# Patient Record
Sex: Female | Born: 1985
Health system: Southern US, Community
[De-identification: ages and names within clinical notes are randomized; demographics above are authoritative.]

## PROBLEM LIST (undated history)

## (undated) ENCOUNTER — Ambulatory Visit (HOSPITAL_COMMUNITY): Admission: EM | Payer: Self-pay | Source: Home / Self Care

## (undated) DIAGNOSIS — IMO0002 Reserved for concepts with insufficient information to code with codable children: Secondary | ICD-10-CM

## (undated) DIAGNOSIS — A749 Chlamydial infection, unspecified: Secondary | ICD-10-CM

## (undated) DIAGNOSIS — Z9889 Other specified postprocedural states: Secondary | ICD-10-CM

## (undated) DIAGNOSIS — A549 Gonococcal infection, unspecified: Secondary | ICD-10-CM

## (undated) DIAGNOSIS — R87619 Unspecified abnormal cytological findings in specimens from cervix uteri: Secondary | ICD-10-CM

## (undated) HISTORY — DX: Reserved for concepts with insufficient information to code with codable children: IMO0002

## (undated) HISTORY — DX: Unspecified abnormal cytological findings in specimens from cervix uteri: R87.619

---
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
## 2010-05-06 ENCOUNTER — Encounter: Payer: Self-pay | Admitting: *Deleted

## 2010-05-06 ENCOUNTER — Encounter: Payer: Self-pay | Admitting: Diagnostic Radiology

## 2011-01-29 ENCOUNTER — Encounter: Payer: Self-pay | Admitting: Physician Assistant

## 2011-02-27 ENCOUNTER — Encounter: Payer: Self-pay | Admitting: Physician Assistant

## 2011-03-29 ENCOUNTER — Encounter: Payer: Self-pay | Admitting: Physician Assistant

## 2011-05-10 ENCOUNTER — Encounter: Payer: Self-pay | Admitting: Physician Assistant

## 2011-05-13 ENCOUNTER — Encounter: Payer: Self-pay | Admitting: Obstetrics & Gynecology

## 2011-05-17 ENCOUNTER — Ambulatory Visit: Payer: Self-pay | Admitting: Obstetrics and Gynecology

## 2011-07-05 ENCOUNTER — Other Ambulatory Visit (HOSPITAL_COMMUNITY)
Admission: RE | Admit: 2011-07-05 | Discharge: 2011-07-05 | Disposition: A | Discharge status: Home / Self Care | Payer: Self-pay | Source: Ambulatory Visit | Attending: Obstetrics & Gynecology | Admitting: Obstetrics & Gynecology

## 2011-07-05 ENCOUNTER — Ambulatory Visit (INDEPENDENT_AMBULATORY_CARE_PROVIDER_SITE_OTHER): Payer: Self-pay | Admitting: Physician Assistant

## 2011-07-05 ENCOUNTER — Encounter: Payer: Self-pay | Admitting: Physician Assistant

## 2011-07-05 VITALS — BP 122/87 | HR 95 | Temp 98.0°F | Ht 62.0 in | Wt 169.3 lb

## 2011-07-05 DIAGNOSIS — IMO0002 Reserved for concepts with insufficient information to code with codable children: Secondary | ICD-10-CM

## 2011-07-05 DIAGNOSIS — Z01812 Encounter for preprocedural laboratory examination: Secondary | ICD-10-CM

## 2011-07-05 DIAGNOSIS — R87619 Unspecified abnormal cytological findings in specimens from cervix uteri: Secondary | ICD-10-CM | POA: Insufficient documentation

## 2011-07-05 DIAGNOSIS — R87811 Vaginal high risk human papillomavirus (HPV) DNA test positive: Secondary | ICD-10-CM

## 2011-07-05 LAB — POCT PREGNANCY, URINE: Preg Test, Ur: NEGATIVE

## 2011-07-05 NOTE — Patient Instructions (Signed)
Colposcopy Care After Colposcopy is a procedure in which a special tool is used to magnify the surface of the cervix. A tissue sample (biopsy) may also be taken. This sample will be looked at for cervical cancer or other problems. After the test:  You may have some cramping.   Lie down for a few minutes if you feel lightheaded.    You may have some bleeding which should stop in a few days.  HOME CARE  Do not have sex or use tampons for 2 to 3 days or as told.   Only take medicine as told by your doctor.   Continue to take your birth control pills as usual.  Finding out the results of your test Ask when your test results will be ready. Make sure you get your test results. GET HELP RIGHT AWAY IF:  You are bleeding a lot or are passing blood clots.   You develop a fever of 102 F (38.9 C) or higher.   You have abnormal vaginal discharge.   You have cramps that do not go away with medicine.   You feel lightheaded, dizzy, or pass out (faint).  MAKE SURE YOU:   Understand these instructions.   Will watch your condition.   Will get help right away if you are not doing well or get worse.  Document Released: 09/18/2007 Document Revised: 03/21/2011 Document Reviewed: 09/18/2007 ExitCare Patient Information 2012 [employer]. 

## 2011-07-05 NOTE — Progress Notes (Signed)
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 Previous colpo was attempted at [facility] but was unsatisfactory. Patient given informed consent, signed copy in the chart, time out was performed.  Placed in lithotomy position. Cervix viewed with speculum and colposcope after application of acetic acid. SCJ was visible with the manipulation of the os with sterile q-tip.  Colposcopy adequate?  yes Acetowhite lesions?yes Punctation?no Mosaicism?  no Abnormal vasculature?  no Biopsies?yes ECC?yes  COMMENTS:Favor HPV/CIN 1 Patient was given post procedure instructions.  She will return in 2 weeks for results.

## 2011-07-08 ENCOUNTER — Other Ambulatory Visit: Payer: Self-pay | Admitting: Physician Assistant

## 2011-07-08 ENCOUNTER — Telehealth: Payer: Self-pay | Admitting: Physician Assistant

## 2011-07-08 NOTE — Telephone Encounter (Signed)
Message copied by Lynnell Dike on Mon Jul 08, 2011  3:58 PM ------      Message from: Maylon Cos E      Created: Mon Jul 08, 2011  3:22 PM       IF patient calls back. Please give colpo results and plan for repeat pap q 6 months. Cancel FU appt, not needed.

## 2011-07-08 NOTE — Telephone Encounter (Signed)
Telephoned pt and she advised had already spoke to Maylon Cos, [place]

## 2011-08-02 ENCOUNTER — Ambulatory Visit: Payer: Self-pay | Admitting: Obstetrics and Gynecology

## 2012-09-09 ENCOUNTER — Ambulatory Visit: Payer: Self-pay | Admitting: Internal Medicine

## 2012-12-13 ENCOUNTER — Emergency Department (HOSPITAL_COMMUNITY): Payer: PRIVATE HEALTH INSURANCE

## 2012-12-13 ENCOUNTER — Encounter (HOSPITAL_COMMUNITY): Payer: Self-pay | Admitting: *Deleted

## 2012-12-13 ENCOUNTER — Emergency Department (HOSPITAL_COMMUNITY)
Admission: EM | Admit: 2012-12-13 | Discharge: 2012-12-13 | Disposition: A | Discharge status: Home / Self Care | Payer: PRIVATE HEALTH INSURANCE | Attending: Emergency Medicine | Admitting: Emergency Medicine

## 2012-12-13 DIAGNOSIS — R0789 Other chest pain: Secondary | ICD-10-CM | POA: Insufficient documentation

## 2012-12-13 DIAGNOSIS — R0602 Shortness of breath: Secondary | ICD-10-CM | POA: Insufficient documentation

## 2012-12-13 DIAGNOSIS — Z791 Long term (current) use of non-steroidal anti-inflammatories (NSAID): Secondary | ICD-10-CM | POA: Insufficient documentation

## 2012-12-13 LAB — CBC WITH DIFFERENTIAL/PLATELET
Basophils Relative: 1 % (ref 0–1)
Eosinophils Relative: 2 % (ref 0–5)
HCT: 43.1 % (ref 36.0–46.0)
Hemoglobin: 15 g/dL (ref 12.0–15.0)
MCH: 30.7 pg (ref 26.0–34.0)
MCHC: 34.8 g/dL (ref 30.0–36.0)
MCV: 88.1 fL (ref 78.0–100.0)
Monocytes Absolute: 0.6 10*3/uL (ref 0.1–1.0)
Monocytes Relative: 7 % (ref 3–12)
Neutro Abs: 3.9 10*3/uL (ref 1.7–7.7)
RDW: 13 % (ref 11.5–15.5)

## 2012-12-13 LAB — POCT I-STAT, CHEM 8
Calcium, Ion: 1.19 mmol/L (ref 1.12–1.23)
Chloride: 109 mEq/L (ref 96–112)
Creatinine, Ser: 1.1 mg/dL (ref 0.50–1.10)
Glucose, Bld: 86 mg/dL (ref 70–99)
Hemoglobin: 15.6 g/dL — ABNORMAL HIGH (ref 12.0–15.0)
Potassium: 3.4 mEq/L — ABNORMAL LOW (ref 3.5–5.1)

## 2012-12-13 MED ORDER — KETOROLAC TROMETHAMINE 30 MG/ML IJ SOLN
30.0000 mg | Freq: Once | INTRAMUSCULAR | Status: AC
  Administered 2012-12-13: 30 mg via INTRAVENOUS
  Filled 2012-12-13: qty 1

## 2012-12-13 MED ORDER — NAPROXEN 500 MG PO TABS: 500.0000 mg | ORAL_TABLET | Freq: Two times a day (BID) | ORAL | Status: DC

## 2012-12-13 NOTE — ED Notes (Signed)
Pt at X-ray

## 2012-12-13 NOTE — ED Notes (Signed)
Gail, NP at bedside. 

## 2012-12-13 NOTE — ED Provider Notes (Signed)
CSN: [id number]     Arrival date & time 12/13/12  4098 History   First MD Initiated Contact with Patient 12/13/12 1911     Chief Complaint  Patient presents with  . Chest Pain   (Consider location/radiation/quality/duration/timing/severity/associated sxs/prior Treatment) HPI Comments: Patient has been having central chest pain lasting minutes to hours.  Patient can not relate aggravating  Factors.  States can take Ibuprofen with relief.  Denies fever, neck pain, cough, Hx asthma, recent travel or chest wall trauma. Does get regular IM injections of Depo for the past 8 years   Patient is a 27 y.o. female presenting with chest pain. The history is provided by the patient.  Chest Pain Pain location:  Substernal area Pain quality: sharp and stabbing   Pain radiates to:  Does not radiate Pain radiates to the back: no   Pain severity:  Severe Onset quality:  Sudden Timing:  Intermittent Progression:  Unable to specify Chronicity:  New Associated symptoms: shortness of breath   Associated symptoms: no cough, no dizziness, no fever, no headache and no nausea     Past Medical History  Diagnosis Date  . Abnormal Pap smear    Past Surgical History  Procedure Laterality Date  . Cesarean section     Family History  Problem Relation Age of Onset  . Hypertension Maternal Grandmother   . Diabetes Maternal Grandmother   . Hypertension Maternal Grandfather   . Diabetes Maternal Grandfather    History  Substance Use Topics  . Smoking status: Never Smoker   . Smokeless tobacco: Never Used  . Alcohol Use: Yes     Comment: occasional    OB History   Grav Para Term Preterm Abortions TAB SAB Ect Mult Living   2 1 1  1 1    1      Review of Systems  Constitutional: Negative for fever and chills.  HENT: Negative for neck pain.   Respiratory: Positive for shortness of breath. Negative for cough, chest tightness and wheezing.   Cardiovascular: Positive for chest pain. Negative for leg  swelling.  Gastrointestinal: Negative for nausea.  Skin: Negative for wound.  Neurological: Negative for dizziness and headaches.  All other systems reviewed and are negative.    Allergies  Doxycycline  Home Medications   Current Outpatient Rx  Name  Route  Sig  Dispense  Refill  . acetaminophen (TYLENOL) 500 MG tablet   Oral   Take 500 mg by mouth every 6 (six) hours as needed for pain.         Marland Kitchen ibuprofen (ADVIL,MOTRIN) 200 MG tablet   Oral   Take 200 mg by mouth every 6 (six) hours as needed for pain.         . medroxyPROGESTERone (DEPO-PROVERA) 150 MG/ML injection   Intramuscular   Inject 150 mg into the muscle every 3 (three) months.         . naproxen (NAPROSYN) 500 MG tablet   Oral   Take 1 tablet (500 mg total) by mouth 2 (two) times daily.   30 tablet   0    BP 101/72  Pulse 73  Temp(Src) 97.9 F (36.6 C) (Oral)  Resp 24  Ht 5\' 2"  (1.575 m)  Wt 183 lb (83.008 kg)  BMI 33.46 kg/m2  SpO2 100% Physical Exam  Nursing note and vitals reviewed. Constitutional: She appears well-developed and well-nourished.  HENT:  Head: Normocephalic.  Eyes: Pupils are equal, round, and reactive to light.  Neck: Normal  range of motion.  Cardiovascular: Normal rate and regular rhythm.   Pulmonary/Chest: Effort normal and breath sounds normal. No respiratory distress. She has no wheezes. She exhibits tenderness.  Abdominal: Soft. Bowel sounds are normal.  Neurological: She is alert.  Skin: Skin is warm.    ED Course  Procedures (including critical care time) Labs Review Labs Reviewed  POCT I-STAT, CHEM 8 - Abnormal; Notable for the following:    Potassium 3.4 (*)    Hemoglobin 15.6 (*)    All other components within normal limits  CBC WITH DIFFERENTIAL  D-DIMER, QUANTITATIVE  POCT I-STAT TROPONIN I   Imaging Review Dg Chest 2 View  12/13/2012   *RADIOLOGY REPORT*  Clinical Data: Chest pain  CHEST - 2 VIEW  Comparison: None.  Findings: Cardiac leads project  over the chest.  The heart, mediastinal, and hilar contours are within normal limits.  The pulmonary vascularity is normal.  The trachea is midline.  Lungs are well expanded and clear.  No acute osseous abnormality. Visualized bowel gas pattern is normal.  IMPRESSION: No acute cardiopulmonary disease.   Original Report Authenticated By: Britta Mccreedy, M.D.    MDM   1. Chest pain, atypical    Negative cardia evalaution as well as your lung examination      Arman Filter, NP 12/13/12 2219

## 2012-12-13 NOTE — ED Notes (Signed)
Patient states that she started having chest pain on Tuesday of this week and it lasted throughout most of the day. Pain has been intermittent and is described as mid chest and feels sharp. Patient states she because she was at work and the pain was severe and she felt as if she could not catch her breath so her co workers called EMS.

## 2012-12-13 NOTE — ED Notes (Signed)
Patient received Aspirin 324 mg from EMS

## 2012-12-13 NOTE — ED Notes (Signed)
Pt currently not experiencing SOB or having diaphoresis but reported those sx with earlier episode of chest pain.

## 2012-12-13 NOTE — ED Notes (Signed)
Pt able to ambulate to restroom with no difficulty.

## 2012-12-13 NOTE — ED Notes (Signed)
Bed: WU98 Expected date:  Expected time:  Means of arrival:  Comments: EMS-CP

## 2012-12-15 NOTE — ED Provider Notes (Signed)
Medical screening examination/treatment/procedure(s) were performed by non-physician practitioner and as supervising physician I was immediately available for consultation/collaboration.    Nelia Shi, MD 12/15/12 [id number]

## 2013-07-03 ENCOUNTER — Emergency Department (INDEPENDENT_AMBULATORY_CARE_PROVIDER_SITE_OTHER)
Admission: EM | Admit: 2013-07-03 | Discharge: 2013-07-03 | Disposition: A | Discharge status: Home / Self Care | Payer: PRIVATE HEALTH INSURANCE | Source: Home / Self Care | Attending: Family Medicine | Admitting: Family Medicine

## 2013-07-03 ENCOUNTER — Other Ambulatory Visit (HOSPITAL_COMMUNITY)
Admission: RE | Admit: 2013-07-03 | Discharge: 2013-07-03 | Disposition: A | Discharge status: Home / Self Care | Payer: PRIVATE HEALTH INSURANCE | Source: Ambulatory Visit | Attending: Family Medicine | Admitting: Family Medicine

## 2013-07-03 ENCOUNTER — Encounter (HOSPITAL_COMMUNITY): Payer: Self-pay | Admitting: Emergency Medicine

## 2013-07-03 DIAGNOSIS — N760 Acute vaginitis: Secondary | ICD-10-CM | POA: Insufficient documentation

## 2013-07-03 DIAGNOSIS — Z113 Encounter for screening for infections with a predominantly sexual mode of transmission: Secondary | ICD-10-CM | POA: Insufficient documentation

## 2013-07-03 DIAGNOSIS — Z202 Contact with and (suspected) exposure to infections with a predominantly sexual mode of transmission: Secondary | ICD-10-CM

## 2013-07-03 HISTORY — DX: Gonococcal infection, unspecified: A54.9

## 2013-07-03 HISTORY — DX: Other specified postprocedural states: Z98.890

## 2013-07-03 HISTORY — DX: Chlamydial infection, unspecified: A74.9

## 2013-07-03 LAB — POCT URINALYSIS DIP (DEVICE)
BILIRUBIN URINE: NEGATIVE
Glucose, UA: NEGATIVE mg/dL
HGB URINE DIPSTICK: NEGATIVE
KETONES UR: NEGATIVE mg/dL
NITRITE: NEGATIVE
PH: 7.5 (ref 5.0–8.0)
Protein, ur: NEGATIVE mg/dL
SPECIFIC GRAVITY, URINE: 1.02 (ref 1.005–1.030)
Urobilinogen, UA: 2 mg/dL — ABNORMAL HIGH (ref 0.0–1.0)

## 2013-07-03 LAB — POCT PREGNANCY, URINE: Preg Test, Ur: NEGATIVE

## 2013-07-03 MED ORDER — AZITHROMYCIN 250 MG PO TABS
ORAL_TABLET | ORAL | Status: AC
  Filled 2013-07-03: qty 4

## 2013-07-03 MED ORDER — CEFTRIAXONE SODIUM 250 MG IJ SOLR
250.0000 mg | Freq: Once | INTRAMUSCULAR | Status: AC
  Administered 2013-07-03: 250 mg via INTRAMUSCULAR

## 2013-07-03 MED ORDER — CEFTRIAXONE SODIUM 250 MG IJ SOLR
INTRAMUSCULAR | Status: AC
  Filled 2013-07-03: qty 250

## 2013-07-03 MED ORDER — AZITHROMYCIN 250 MG PO TABS
1000.0000 mg | ORAL_TABLET | Freq: Every day | ORAL | Status: DC
  Administered 2013-07-03: 1000 mg via ORAL

## 2013-07-03 MED ORDER — AZITHROMYCIN 250 MG PO TABS: 1000.0000 mg | ORAL_TABLET | Freq: Once | ORAL | Status: DC

## 2013-07-03 MED ORDER — LIDOCAINE HCL (PF) 1 % IJ SOLN
INTRAMUSCULAR | Status: AC
  Filled 2013-07-03: qty 5

## 2013-07-03 NOTE — Discharge Instructions (Signed)
Sexually Transmitted Disease A sexually transmitted disease (STD) is a disease or infection that may be passed (transmitted) from person to person, usually during sexual activity. This may happen by way of saliva, semen, blood, vaginal mucus, or urine. Common STDs include:   Gonorrhea.   Chlamydia.   Syphilis.   HIV and AIDS.   Genital herpes.   Hepatitis B and C.   Trichomonas.   Human papillomavirus (HPV).   Pubic lice.   Scabies.  Mites.  Bacterial vaginosis. WHAT ARE CAUSES OF STDs? An STD may be caused by bacteria, a virus, or parasites. STDs are often transmitted during sexual activity if one person is infected. However, they may also be transmitted through nonsexual means. STDs may be transmitted after:   Sexual intercourse with an infected person.   Sharing sex toys with an infected person.   Sharing needles with an infected person or using unclean piercing or tattoo needles.  Having intimate contact with the genitals, mouth, or rectal areas of an infected person.   Exposure to infected fluids during birth. WHAT ARE THE SIGNS AND SYMPTOMS OF STDs? Different STDs have different symptoms. Some people may not have any symptoms. If symptoms are present, they may include:   Painful or bloody urination.   Pain in the pelvis, abdomen, vagina, anus, throat, or eyes.   Skin rash, itching, irritation, growths, sores (lesions), ulcerations, or warts in the genital or anal area.  Abnormal vaginal discharge with or without bad odor.   Penile discharge in men.   Fever.   Pain or bleeding during sexual intercourse.   Swollen glands in the groin area.   Yellow skin and eyes (jaundice). This is seen with hepatitis.   Swollen testicles.  Infertility.  Sores and blisters in the mouth. HOW ARE STDs DIAGNOSED? To make a diagnosis, your health care provider may:   Take a medical history.   Perform a physical exam.   Take a sample of any  discharge for examination.  Swab the throat, cervix, opening to the penis, rectum, or vagina for testing.  Test a sample of your first morning urine.   Perform blood tests.   Perform a Pap smear, if this applies.   Perform a colposcopy.   Perform a laparoscopy.  HOW ARE STDs TREATED? Treatment depends on the STD. Some STDs may be treated but not cured.   Chlamydia, gonorrhea, trichomonas, and syphilis can be cured with antibiotics.   Genital herpes, hepatitis, and HIV can be treated, but not cured, with prescribed medicines. The medicines lessen symptoms.   Genital warts from HPV can be treated with medicine or by freezing, burning (electrocautery), or surgery. Warts may come back.   HPV cannot be cured with medicine or surgery. However, abnormal areas may be removed from the cervix, vagina, or vulva.   If your diagnosis is confirmed, your recent sexual partners need treatment. This is true even if they are symptom-free or have a negative culture or evaluation. They should not have sex until their health care providers say it is OK. HOW CAN I REDUCE MY RISK OF GETTING AN STD?  Use latex condoms, dental dams, and water-soluble lubricants during sexual activity. Do not use petroleum jelly or oils.  Get vaccinated for HPV and hepatitis. If you have not received these vaccines in the past, talk to your health care provider about whether one or both might be right for you.   Avoid risky sex practices that can break the skin.  WHAT SHOULD   I DO IF I THINK I HAVE AN STD?  See your health care provider.   Inform all sexual partners. They should be tested and treated for any STDs.  Do not have sex until your health care provider says it is OK. WHEN SHOULD I GET HELP? Seek immediate medical care if:  You develop severe abdominal pain.  You are a man and notice swelling or pain in the testicles.  You are a woman and notice swelling or pain in your vagina. Document  Released: 06/22/2002 Document Revised: 01/20/2013 Document Reviewed: 10/20/2012 ExitCare Patient Information 2014 [employer].  

## 2013-07-03 NOTE — ED Notes (Signed)
States partner has NGU.  Pt has not vaginal discharge or any c/o's, but wishes to eval for STDs and possible treatment.

## 2013-07-03 NOTE — ED Provider Notes (Signed)
CSN: [id number]     Arrival date & time 07/03/13  1837 History   First MD Initiated Contact with Patient 07/03/13 2005     Chief Complaint  Patient presents with  . Exposure to STD   (Consider location/radiation/quality/duration/timing/severity/associated sxs/prior Treatment) HPI Comments: 28 year old female presents complaining of STD exposure. She was called by her boyfriend to say that he tested positive for NGU and that he would like her to be tested. She denies any symptoms. She specifically denies vaginal discharge, dysuria, or genital lesions, abdominal pains.  Patient is a 28 y.o. female presenting with STD exposure.  Exposure to STD Pertinent negatives include no chest pain, no abdominal pain and no shortness of breath.    Past Medical History  Diagnosis Date  . Abnormal Pap smear   . Chlamydia   . Gonorrhea   . History of elective abortion    Past Surgical History  Procedure Laterality Date  . Cesarean section     Family History  Problem Relation Age of Onset  . Hypertension Maternal Grandmother   . Diabetes Maternal Grandmother   . Hypertension Maternal Grandfather   . Diabetes Maternal Grandfather    History  Substance Use Topics  . Smoking status: Never Smoker   . Smokeless tobacco: Never Used  . Alcohol Use: Yes     Comment: occasional    OB History   Grav Para Term Preterm Abortions TAB SAB Ect Mult Living   2 1 1  1 1    1      Review of Systems  Constitutional: Negative for fever and chills.  Eyes: Negative for visual disturbance.  Respiratory: Negative for cough and shortness of breath.   Cardiovascular: Negative for chest pain, palpitations and leg swelling.  Gastrointestinal: Negative for nausea, vomiting and abdominal pain.  Endocrine: Negative for polydipsia and polyuria.  Genitourinary: Negative for dysuria, urgency, frequency, hematuria, vaginal discharge, difficulty urinating, genital sores, vaginal pain, pelvic pain and dyspareunia.   Musculoskeletal: Negative for arthralgias and myalgias.  Skin: Negative for rash.  Neurological: Negative for dizziness, weakness and light-headedness.    Allergies  Doxycycline  Home Medications   Current Outpatient Rx  Name  Route  Sig  Dispense  Refill  . medroxyPROGESTERone (DEPO-PROVERA) 150 MG/ML injection   Intramuscular   Inject 150 mg into the muscle every 3 (three) months.         . naproxen (NAPROSYN) 500 MG tablet   Oral   Take 1 tablet (500 mg total) by mouth 2 (two) times daily.   30 tablet   0   . acetaminophen (TYLENOL) 500 MG tablet   Oral   Take 500 mg by mouth every 6 (six) hours as needed for pain.         Marland Kitchen. ibuprofen (ADVIL,MOTRIN) 200 MG tablet   Oral   Take 200 mg by mouth every 6 (six) hours as needed for pain.          BP 116/83  Pulse 77  Temp(Src) 98.4 F (36.9 C) (Oral)  Resp 18  SpO2 99% Physical Exam  Nursing note and vitals reviewed. Constitutional: She is oriented to person, place, and time. Vital signs are normal. She appears well-developed and well-nourished. No distress.  HENT:  Head: Normocephalic and atraumatic.  Pulmonary/Chest: Effort normal. No respiratory distress.  Abdominal: Soft. She exhibits no mass. There is no tenderness. There is no rebound and no guarding.  Neurological: She is alert and oriented to person, place, and time. She has  normal strength. Coordination normal.  Skin: Skin is warm and dry. No rash noted. She is not diaphoretic.  Psychiatric: She has a normal mood and affect. Judgment normal.    ED Course  Procedures (including critical care time) Labs Review Labs Reviewed  POCT URINALYSIS DIP (DEVICE) - Abnormal; Notable for the following:    Urobilinogen, UA 2.0 (*)    Leukocytes, UA SMALL (*)    All other components within normal limits  RPR  HIV ANTIBODY (ROUTINE TESTING)  POCT PREGNANCY, URINE  URINE CYTOLOGY ANCILLARY ONLY   Imaging Review No results found.   MDM   1. Exposure to  STD    Treating with ceftriaxone and with azithromycin. Labs sent off for STDs. Followup when necessary.  Meds ordered this encounter  Medications  . cefTRIAXone (ROCEPHIN) injection 250 mg    Sig:   . DISCONTD: azithromycin (ZITHROMAX) tablet 1,000 mg    Sig:   . azithromycin (ZITHROMAX) tablet 1,000 mg    Sig:        Graylon Good, PA-C 07/03/13 2021

## 2013-07-03 NOTE — ED Notes (Signed)
Denies any S/S allergic reaction. 

## 2013-07-04 LAB — HIV ANTIBODY (ROUTINE TESTING W REFLEX): HIV: NONREACTIVE

## 2013-07-04 LAB — RPR: RPR Ser Ql: NONREACTIVE

## 2013-07-05 NOTE — ED Provider Notes (Signed)
Medical screening examination/treatment/procedure(s) were performed by a resident physician or non-physician practitioner and as the supervising physician I was immediately available for consultation/collaboration.  Tage Feggins, MD    Delicia Berens S Artice Bergerson, MD 07/05/13 0805 

## 2013-07-06 LAB — URINE CYTOLOGY ANCILLARY ONLY
Chlamydia: POSITIVE — AB
Neisseria Gonorrhea: NEGATIVE
TRICH (WINDOWPATH): NEGATIVE

## 2013-07-06 NOTE — ED Notes (Addendum)
GC neg., Chlamydia pos., Trich neg.  Pt. adequately treated with Zithromax and Rocephin.  DHHS [facility]. [name], [name] 07/06/2013 Prevotella Bivia pos.  [name] e-prescribed Flagyl to [facility] on [address]  I called cell # and it was not in service.  I called other number and no answer- could not leave a message. I called contact- mother and left a message for pt. to call. Call 1. [name], [name] 07/08/2013

## 2013-07-07 LAB — URINE CYTOLOGY ANCILLARY ONLY
BACTERIAL VAGINITIS: POSITIVE — AB
Bacterial vaginitis: POSITIVE — AB
CANDIDA VAGINITIS: NEGATIVE

## 2013-07-08 ENCOUNTER — Telehealth (HOSPITAL_COMMUNITY): Payer: Self-pay | Admitting: *Deleted

## 2013-07-08 ENCOUNTER — Telehealth (HOSPITAL_COMMUNITY): Payer: Self-pay | Admitting: Emergency Medicine

## 2013-07-08 MED ORDER — METRONIDAZOLE 500 MG PO TABS: 500.0000 mg | ORAL_TABLET | Freq: Two times a day (BID) | ORAL | Status: DC

## 2013-07-08 NOTE — Telephone Encounter (Signed)
Kristin AndersonSuzanne York, RN, presented lab results. Pt has bacterial vaginosis. Rx flagyl to cover. Also pos for chlamydia but was tx at time of original visit. S Lang will contact pt with results.

## 2013-07-09 NOTE — ED Notes (Addendum)
I called pt. Pt. verified x 2 and given results.  Pt. Told she was adequately treated Zithromax for Chlamydia and needs Flagyl for bacterial vaginosis.  Pt. instructed to notify her partner, no sex for 1 week and to practice safe sex. Pt. told she can [facility], by appointment.  Pt. told where to pick up her Rx. of Flagyl.   Pt. instructed to no alcohol while taking this medication.  Pt. voiced understanding. [name], Zuriyah Shatz M 07/09/2013 Phone number corrected in system. 07/09/2013

## 2014-02-14 ENCOUNTER — Encounter (HOSPITAL_COMMUNITY): Payer: Self-pay | Admitting: Emergency Medicine

## 2015-08-21 ENCOUNTER — Emergency Department (HOSPITAL_COMMUNITY)
Admission: EM | Admit: 2015-08-21 | Discharge: 2015-08-21 | Disposition: A | Discharge status: Home / Self Care | Payer: PRIVATE HEALTH INSURANCE | Attending: Emergency Medicine | Admitting: Emergency Medicine

## 2015-08-21 ENCOUNTER — Encounter (HOSPITAL_COMMUNITY): Payer: Self-pay | Admitting: Emergency Medicine

## 2015-08-21 ENCOUNTER — Emergency Department (HOSPITAL_COMMUNITY): Payer: PRIVATE HEALTH INSURANCE

## 2015-08-21 DIAGNOSIS — Z8619 Personal history of other infectious and parasitic diseases: Secondary | ICD-10-CM | POA: Insufficient documentation

## 2015-08-21 DIAGNOSIS — S3992XA Unspecified injury of lower back, initial encounter: Secondary | ICD-10-CM | POA: Insufficient documentation

## 2015-08-21 DIAGNOSIS — Y998 Other external cause status: Secondary | ICD-10-CM | POA: Diagnosis not present

## 2015-08-21 DIAGNOSIS — Y9389 Activity, other specified: Secondary | ICD-10-CM | POA: Insufficient documentation

## 2015-08-21 DIAGNOSIS — Z792 Long term (current) use of antibiotics: Secondary | ICD-10-CM | POA: Insufficient documentation

## 2015-08-21 DIAGNOSIS — S4991XA Unspecified injury of right shoulder and upper arm, initial encounter: Secondary | ICD-10-CM | POA: Diagnosis present

## 2015-08-21 DIAGNOSIS — M546 Pain in thoracic spine: Secondary | ICD-10-CM

## 2015-08-21 DIAGNOSIS — M545 Low back pain, unspecified: Secondary | ICD-10-CM

## 2015-08-21 DIAGNOSIS — Z791 Long term (current) use of non-steroidal anti-inflammatories (NSAID): Secondary | ICD-10-CM | POA: Diagnosis not present

## 2015-08-21 DIAGNOSIS — S29002A Unspecified injury of muscle and tendon of back wall of thorax, initial encounter: Secondary | ICD-10-CM | POA: Insufficient documentation

## 2015-08-21 DIAGNOSIS — Z793 Long term (current) use of hormonal contraceptives: Secondary | ICD-10-CM | POA: Diagnosis not present

## 2015-08-21 DIAGNOSIS — Y92410 Unspecified street and highway as the place of occurrence of the external cause: Secondary | ICD-10-CM | POA: Insufficient documentation

## 2015-08-21 DIAGNOSIS — M25511 Pain in right shoulder: Secondary | ICD-10-CM

## 2015-08-21 LAB — POC URINE PREG, ED: Preg Test, Ur: NEGATIVE

## 2015-08-21 MED ORDER — IBUPROFEN 800 MG PO TABS: 800.0000 mg | ORAL_TABLET | Freq: Three times a day (TID) | ORAL | Status: DC

## 2015-08-21 MED ORDER — CYCLOBENZAPRINE HCL 10 MG PO TABS
10.0000 mg | ORAL_TABLET | Freq: Two times a day (BID) | ORAL | Status: DC | PRN
Start: 2015-08-21 — End: 2019-04-03

## 2015-08-21 MED ORDER — DIAZEPAM 5 MG PO TABS
5.0000 mg | ORAL_TABLET | Freq: Once | ORAL | Status: AC
  Administered 2015-08-21: 5 mg via ORAL
  Filled 2015-08-21: qty 1

## 2015-08-21 MED ORDER — ACETAMINOPHEN 325 MG PO TABS
650.0000 mg | ORAL_TABLET | Freq: Once | ORAL | Status: AC
  Administered 2015-08-21: 650 mg via ORAL
  Filled 2015-08-21: qty 2

## 2015-08-21 MED ORDER — IBUPROFEN 800 MG PO TABS
800.0000 mg | ORAL_TABLET | Freq: Once | ORAL | Status: AC
  Administered 2015-08-21: 800 mg via ORAL
  Filled 2015-08-21: qty 1

## 2015-08-21 NOTE — Discharge Instructions (Signed)
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
## 2016-07-27 ENCOUNTER — Encounter (HOSPITAL_COMMUNITY): Payer: Self-pay

## 2016-07-27 ENCOUNTER — Emergency Department (HOSPITAL_COMMUNITY)
Admission: EM | Admit: 2016-07-27 | Discharge: 2016-07-27 | Disposition: A | Discharge status: Home / Self Care | Payer: PRIVATE HEALTH INSURANCE | Attending: Emergency Medicine | Admitting: Emergency Medicine

## 2016-07-27 DIAGNOSIS — J069 Acute upper respiratory infection, unspecified: Secondary | ICD-10-CM | POA: Insufficient documentation

## 2016-07-27 DIAGNOSIS — B9789 Other viral agents as the cause of diseases classified elsewhere: Secondary | ICD-10-CM

## 2016-07-27 LAB — RAPID STREP SCREEN (MED CTR MEBANE ONLY): STREPTOCOCCUS, GROUP A SCREEN (DIRECT): NEGATIVE

## 2016-07-27 MED ORDER — DEXAMETHASONE SODIUM PHOSPHATE 10 MG/ML IJ SOLN
10.0000 mg | Freq: Once | INTRAMUSCULAR | Status: AC
  Administered 2016-07-27: 10 mg via INTRAMUSCULAR
  Filled 2016-07-27: qty 1

## 2016-07-27 MED ORDER — BENZONATATE 100 MG PO CAPS: 100.0000 mg | ORAL_CAPSULE | Freq: Three times a day (TID) | ORAL | 0 refills | Status: DC

## 2016-07-27 MED ORDER — BENZONATATE 100 MG PO CAPS
100.0000 mg | ORAL_CAPSULE | Freq: Once | ORAL | Status: AC
  Administered 2016-07-27: 100 mg via ORAL
  Filled 2016-07-27: qty 1

## 2016-07-27 MED ORDER — FLUTICASONE PROPIONATE 50 MCG/ACT NA SUSP: 2.0000 | Freq: Every day | NASAL | 12 refills | Status: DC

## 2016-07-27 NOTE — ED Provider Notes (Signed)
[facility]-EMERGENCY DEPT Provider Note   CSN: [id number] Arrival date & time: 07/27/16  4098     History   Chief Complaint Chief Complaint  Patient presents with  . Sore Throat    HPI Kristin Lang is a 31 y.o. female.  HPI 31 year old African-American female with no significant past medical history presents to the ED today with complaints of URI symptoms including sore throat, nonproductive cough, rhinorrhea onset 3 days ago. Patient states that it is painful to swallow. She endorses a dry nonproductive cough along with rhinorrhea and sinus congestion. Denies any fevers. 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 Says that she's had strep throat in the past and this feels similar. She did take ibuprofen at home with some relief in her sore throat which has slightly resolved since being in the ED today. She denies any shortness of breath, difficulty swallowing, difficulty breathing, chest pain, nausea, vomiting, abdominal pain. Past Medical History:  Diagnosis Date  . Abnormal Pap smear   . Chlamydia   . Gonorrhea   . History of elective abortion     Patient Active Problem List   Diagnosis Date Noted  . 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 CESAREAN SECTION      OB History    Gravida Para Term Preterm AB Living   SAB TAB Ectopic Multiple Live Births     1             Home Medications    Prior to Admission medications   Medication Sig Start Date End Date Taking? Authorizing Provider  acetaminophen (TYLENOL) 500 MG tablet Take 500 mg by mouth every 6 (six) hours as needed for pain.    Historical Provider, MD  benzonatate (TESSALON) 100 MG capsule Take 1 capsule (100 mg total) by mouth every 8 (eight) hours. 07/27/16   Rise Mu, PA-C  cyclobenzaprine (FLEXERIL) 10 MG tablet Take 1 tablet (10 mg total) by mouth 2 (two) times daily as needed for  muscle spasms. 08/21/15   Kayla Rose, PA-C  fluticasone (FLONASE) 50 MCG/ACT nasal spray Place 2 sprays into both nostrils daily. 07/27/16   Rise Mu, PA-C  ibuprofen (ADVIL,MOTRIN) 800 MG tablet Take 1 tablet (800 mg total) by mouth 3 (three) times daily. 08/21/15   Cheri Fowler, PA-C  medroxyPROGESTERone (DEPO-PROVERA) 150 MG/ML injection Inject 150 mg into the muscle every 3 (three) months.    Historical Provider, MD  metroNIDAZOLE (FLAGYL) 500 MG tablet Take 1 tablet (500 mg total) by mouth 2 (two) times daily. 07/08/13   Cathlyn Parsons, NP  naproxen (NAPROSYN) 500 MG tablet Take 1 tablet (500 mg total) by mouth 2 (two) times daily. 12/13/12   Earley Favor, NP    Family History Family History  Problem Relation Age of Onset  . Hypertension Maternal Grandmother   . Diabetes Maternal Grandmother   . Hypertension Maternal Grandfather   . Diabetes Maternal Grandfather     Social History Social History  Substance Use Topics  . Smoking status: Never Smoker  . Smokeless tobacco: Never Used  . Alcohol use Yes     Comment: occasional      Allergies   Doxycycline   Review of Systems Review of Systems  Constitutional: Negative for chills and fever.  HENT: Positive for congestion, postnasal drip, rhinorrhea, sinus pressure,  sneezing and sore throat. Negative for trouble swallowing and voice change.   Eyes: Negative for visual disturbance.  Respiratory: Positive for cough. Negative for shortness of breath.   Cardiovascular: Negative for chest pain.  Gastrointestinal: Negative for nausea and vomiting.  Neurological: Negative for headaches.     Physical Exam Updated Vital Signs BP 109/89 (BP Location: Left Arm)   Pulse 90   Temp 98.2 F (36.8 C) (Oral)   Resp 12   SpO2 100%   Physical Exam  Constitutional: She is oriented to person, place, and time. She appears well-developed and well-nourished. No distress.  HENT:  Head: Normocephalic and atraumatic.  Right Ear: Tympanic  membrane, external ear and ear canal normal.  Left Ear: Tympanic membrane, external ear and ear canal normal.  Nose: Mucosal edema and rhinorrhea present.  Mouth/Throat: Uvula is midline and mucous membranes are normal. No trismus in the jaw. No uvula swelling. Posterior oropharyngeal edema and posterior oropharyngeal erythema present. No oropharyngeal exudate or tonsillar abscesses. Tonsils are 1+ on the right. Tonsils are 1+ on the left. No tonsillar exudate.  Eyes: Right eye exhibits no discharge. Left eye exhibits no discharge. No scleral icterus.  Neck: Normal range of motion. Neck supple.  Pulmonary/Chest: Effort normal and breath sounds normal. No respiratory distress. She has no wheezes. She has no rales. She exhibits no tenderness.  Musculoskeletal: Normal range of motion.  Lymphadenopathy:    She has no cervical adenopathy.  Neurological: She is alert and oriented to person, place, and time.  Skin: Capillary refill takes less than 2 seconds. No pallor.  Nursing note and vitals reviewed.    ED Treatments / Results  Labs (all labs ordered are listed, but only abnormal results are displayed) Labs Reviewed  RAPID STREP SCREEN (NOT AT [facility])  CULTURE, GROUP A STREP [facility])    EKG  EKG Interpretation None       Radiology No results found.  Procedures Procedures (including critical care time)  Medications Ordered in ED Medications  dexamethasone (DECADRON) injection 10 mg (10 mg Intramuscular Given 07/27/16 0920)  benzonatate (TESSALON) capsule 100 mg (100 mg Oral Given 07/27/16 6045)     Initial Impression / Assessment and Plan / ED Course  I have reviewed the triage vital signs and the nursing notes.  Pertinent labs & imaging results that were available during my care of the patient were reviewed by me and considered in my medical decision making (see chart for details).     Patient presents to the ED with URI symptoms. Lungs clear to auscultation bilaterally. Do  not feel a chest x-ray is indicated at this time. Pt afebrile without tonsillar exudate, negative strep. Presents with mild dysphagia; diagnosis of viral pharyngitis vs viral URI with cough. No abx indicated. DC w symptomatic tx for pain  Pt does not appear dehydrated, but did discuss importance of water rehydration. Presentation non concerning for PTA or infxn spread to soft tissue. No trismus or uvula deviation. Specific return precautions discussed. Pt able to drink water in ED without difficulty with intact air way. Recommended PCP follow up.   Final Clinical Impressions(s) / ED Diagnoses   Final diagnoses:  Viral URI with cough    New Prescriptions Discharge Medication List as of 07/27/2016  9:14 AM    START taking these medications   Details  benzonatate (TESSALON) 100 MG capsule Take 1 capsule (100 mg total) by mouth every 8 (eight) hours., Starting Sat 07/27/2016, Print    fluticasone (FLONASE) 50  MCG/ACT nasal spray Place 2 sprays into both nostrils daily., Starting Sat 07/27/2016, Print         Rise Mu, PA-C 07/27/16 1004    Tilden Fossa, MD 07/28/16 [phone]

## 2016-07-27 NOTE — ED Notes (Signed)
Bed: WTR6 Expected date:  Expected time:  Means of arrival:  Comments: 

## 2016-07-27 NOTE — Discharge Instructions (Signed)
Your strep test was negative. This is likely a viral upper respiratory tract infection. Motrin Tylenol for pain. Take Tessalon for cough. Use the Flonase for nasal congestion. Follow-up with primary care doctor symptoms are not improving. Return to the ED if swelling worsens or or if he developed worsening cough with sputum production and fever.

## 2016-07-27 NOTE — ED Triage Notes (Signed)
She c/o sore throat plus occasional cough x 3 days.

## 2016-07-29 LAB — CULTURE, GROUP A STREP (THRC)

## 2017-07-31 DIAGNOSIS — Z3042 Encounter for surveillance of injectable contraceptive: Secondary | ICD-10-CM | POA: Diagnosis not present

## 2017-07-31 DIAGNOSIS — J302 Other seasonal allergic rhinitis: Secondary | ICD-10-CM | POA: Diagnosis not present

## 2017-07-31 DIAGNOSIS — J029 Acute pharyngitis, unspecified: Secondary | ICD-10-CM | POA: Diagnosis not present

## 2017-10-22 DIAGNOSIS — Z3042 Encounter for surveillance of injectable contraceptive: Secondary | ICD-10-CM | POA: Diagnosis not present

## 2018-01-07 DIAGNOSIS — Z3042 Encounter for surveillance of injectable contraceptive: Secondary | ICD-10-CM | POA: Diagnosis not present

## 2018-04-06 DIAGNOSIS — Z3042 Encounter for surveillance of injectable contraceptive: Secondary | ICD-10-CM | POA: Diagnosis not present

## 2018-05-05 DIAGNOSIS — J301 Allergic rhinitis due to pollen: Secondary | ICD-10-CM | POA: Diagnosis not present

## 2018-05-05 MED FILL — SM LORATADINE 10 MG TABS: 10 | 90 days supply | Qty: 90 | Fill #0

## 2018-05-05 MED FILL — hydrOXYzine HCL 50 MG TABS: 50 | 90 days supply | Qty: 90 | Fill #0

## 2018-07-02 DIAGNOSIS — Z3042 Encounter for surveillance of injectable contraceptive: Secondary | ICD-10-CM | POA: Diagnosis not present

## 2018-07-27 ENCOUNTER — Telehealth: Payer: Self-pay | Admitting: Family

## 2018-07-27 MED ORDER — SULFAMETHOXAZOLE-TRIMETHOPRIM 800-160 MG PO TABS: 1.0000 | ORAL_TABLET | Freq: Two times a day (BID) | ORAL | 0 refills | Status: DC

## 2018-07-27 MED ORDER — PHENAZOPYRIDINE HCL 100 MG PO TABS: 100.0000 mg | ORAL_TABLET | Freq: Three times a day (TID) | ORAL | 0 refills | Status: DC | PRN

## 2018-07-27 NOTE — Telephone Encounter (Signed)
Bactrim DS 800/160 mg tablets BID, # 20 with no RF's and Pyridium 100 mg 3 times daily for pain, # 10 with no RF's. RX for above e-scribed and sent to pharmacy on record  [facility] - [place], [place] - [place] [address] [place] [address] Phone: [phone] Fax: [phone]

## 2018-09-18 DIAGNOSIS — Z0000 Encounter for general adult medical examination without abnormal findings: Secondary | ICD-10-CM | POA: Diagnosis not present

## 2018-09-18 DIAGNOSIS — Z3042 Encounter for surveillance of injectable contraceptive: Secondary | ICD-10-CM | POA: Diagnosis not present

## 2018-11-13 ENCOUNTER — Telehealth: Payer: Self-pay | Admitting: Family

## 2018-11-13 MED ORDER — CEPHALEXIN 500 MG PO CAPS: 500.0000 mg | ORAL_CAPSULE | Freq: Two times a day (BID) | ORAL | 1 refills | Status: DC

## 2018-11-13 NOTE — Telephone Encounter (Signed)
Patient having an issue with boils under her arm and needing Abx for treatment. Keflex 500 mg BID # 20 for 10 day treatment with 1 RF's. RX for above e-scribed and sent to pharmacy on record  [place], [place] [address] Phone: [phone] Fax: [phone]

## 2018-11-30 DIAGNOSIS — Z111 Encounter for screening for respiratory tuberculosis: Secondary | ICD-10-CM | POA: Diagnosis not present

## 2018-12-03 DIAGNOSIS — Z3042 Encounter for surveillance of injectable contraceptive: Secondary | ICD-10-CM | POA: Diagnosis not present

## 2019-02-16 DIAGNOSIS — Z3042 Encounter for surveillance of injectable contraceptive: Secondary | ICD-10-CM | POA: Diagnosis not present

## 2019-04-03 ENCOUNTER — Ambulatory Visit (HOSPITAL_COMMUNITY)
Admission: EM | Admit: 2019-04-03 | Discharge: 2019-04-03 | Disposition: A | Discharge status: Home / Self Care | Payer: 59 | Attending: Urgent Care | Admitting: Urgent Care

## 2019-04-03 ENCOUNTER — Encounter (HOSPITAL_COMMUNITY): Payer: Self-pay | Admitting: *Deleted

## 2019-04-03 ENCOUNTER — Other Ambulatory Visit: Payer: Self-pay

## 2019-04-03 DIAGNOSIS — M545 Low back pain, unspecified: Secondary | ICD-10-CM

## 2019-04-03 DIAGNOSIS — M5380 Other specified dorsopathies, site unspecified: Secondary | ICD-10-CM

## 2019-04-03 DIAGNOSIS — S39012A Strain of muscle, fascia and tendon of lower back, initial encounter: Secondary | ICD-10-CM

## 2019-04-03 MED ORDER — CYCLOBENZAPRINE HCL 10 MG PO TABS: 10.0000 mg | ORAL_TABLET | Freq: Two times a day (BID) | ORAL | 0 refills | Status: DC | PRN

## 2019-04-03 MED ORDER — PREDNISONE 20 MG PO TABS: ORAL_TABLET | ORAL | 0 refills | Status: DC

## 2019-04-03 NOTE — ED Triage Notes (Signed)
Pt states slept in a recliner approx 2 wks ago and had some slight left low back pain; now low back pain radiating into middle of low back.  Has been taking IBU, Naproxen, and using heat without relief.  Pain worse with sitting.  Denies parasthesias.
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
## 2019-05-07 DIAGNOSIS — Z30019 Encounter for initial prescription of contraceptives, unspecified: Secondary | ICD-10-CM | POA: Diagnosis not present

## 2019-05-27 ENCOUNTER — Telehealth: Payer: Self-pay | Admitting: Family

## 2019-05-27 MED ORDER — FLUCONAZOLE 150 MG PO TABS: 150.0000 mg | ORAL_TABLET | Freq: Every day | ORAL | 0 refills | Status: DC

## 2019-05-27 NOTE — Telephone Encounter (Signed)
Diflucan 150 mg 5 tablets with no RF's.RX for above e-scribed and sent to pharmacy on record  [facility] - [place], [place] - [place] [address] [place] [address] Phone: [phone] Fax: [phone]

## 2019-05-28 ENCOUNTER — Telehealth: Payer: Self-pay | Admitting: Family

## 2019-05-28 MED ORDER — CLOTRIMAZOLE-BETAMETHASONE 1-0.05 % EX CREA: TOPICAL_CREAM | Freq: Three times a day (TID) | CUTANEOUS | 1 refills | Status: DC

## 2019-05-28 NOTE — Telephone Encounter (Signed)
Lotrisone Cream 3 times daily, 45 gram tube with 1 RF's.RX for above e-scribed and sent to pharmacy on record   [id number] [place], [place] - [address] AT [facility] [address] [facility] [address] [name] [id number] Phone: [phone] Fax: [phone]

## 2019-06-11 ENCOUNTER — Ambulatory Visit
Admission: EM | Admit: 2019-06-11 | Discharge: 2019-06-11 | Disposition: A | Discharge status: Home / Self Care | Payer: 59 | Attending: Emergency Medicine | Admitting: Emergency Medicine

## 2019-06-11 DIAGNOSIS — B9689 Other specified bacterial agents as the cause of diseases classified elsewhere: Secondary | ICD-10-CM | POA: Diagnosis not present

## 2019-06-11 DIAGNOSIS — N760 Acute vaginitis: Secondary | ICD-10-CM | POA: Diagnosis not present

## 2019-06-11 MED ORDER — NYSTATIN 100000 UNIT/GM EX POWD: 1.0000 "application " | Freq: Three times a day (TID) | CUTANEOUS | 0 refills | Status: DC

## 2019-06-11 NOTE — ED Provider Notes (Signed)
[facility]    CSN: [id number] Arrival date & time: 06/11/19  1646      History   Chief Complaint Chief Complaint  Patient presents with  . Rash    HPI Kristin Lang is a 34 y.o. female with history of obesity presenting for bilateral upper thigh rash for last month.  Patient states has had multiple virtual visits for this: Has tried Lotrisone, nystatin powder and cream, multiple rounds of Diflucan without resolution.  Patient states she is currently using the nystatin powder and Lotrisone which has been helping.  Patient got significant improvement with discomfort from ibuprofen yesterday.  Patient states that she is used to having this happen summer, unsure why is having now.  Patient also endorsing history of BV: States this feels similar.  Denies current sexual activity: Last coitus in January.  Denies fever, abdominal pain, pelvic pain, back pain.    Past Medical History:  Diagnosis Date  . Abnormal Pap smear   . Chlamydia   . Gonorrhea   . History of elective abortion     Patient Active Problem List   Diagnosis Date Noted  . 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 CESAREAN SECTION      OB History    Gravida  2   Para  1   Term  1   Preterm      AB  1   Living  1     SAB      TAB  1   Ectopic      Multiple      Live Births               Home Medications    Prior to Admission medications   Medication Sig Start Date End Date Taking? Authorizing Provider  clotrimazole-betamethasone (LOTRISONE) cream Apply topically 3 (three) times daily. 05/28/19   Paretta-Leahey, Haze Boyden, NP  cyclobenzaprine (FLEXERIL) 10 MG tablet Take 1 tablet (10 mg total) by mouth 2 (two) times daily as needed for muscle spasms. 04/03/19   Jaynee Eagles, PA-C  ibuprofen (ADVIL,MOTRIN) 800 MG tablet Take 1 tablet (800 mg total) by mouth 3 (three) times daily. 08/21/15   Gloriann Loan, PA-C   medroxyPROGESTERone (DEPO-PROVERA) 150 MG/ML injection Inject 150 mg into the muscle every 3 (three) months.    [provider]  nystatin (MYCOSTATIN/NYSTOP) powder Apply 1 application topically 3 (three) times daily. 06/11/19   Hall-Potvin, [place], PA-C  fluticasone (FLONASE) 50 MCG/ACT nasal spray Place 2 sprays into both nostrils daily. 07/27/16 04/03/19  Doristine Devoid, PA-C    Family History Family History  Problem Relation Age of Onset  . Hypertension Maternal Grandmother   . Diabetes Maternal Grandmother   . Hypertension Maternal Grandfather   . Diabetes Maternal Grandfather     Social History Social History   Tobacco Use  . Smoking status: Never Smoker  . Smokeless tobacco: Never Used  Substance Use Topics  . Alcohol use: Yes    Comment: daily for last couple weeks  . Drug use: No     Allergies   Doxycycline   Review of Systems As per HPI   Physical Exam Triage Vital Signs ED Triage Vitals  Enc Vitals Group     BP      Pulse      Resp      Temp      Temp src  SpO2      Weight      Height      Head Circumference      Peak Flow      Pain Score      Pain Loc      Pain Edu?      Excl. in GC?    No data found.  Updated Vital Signs BP 118/84 (BP Location: Left Arm)   Pulse (!) 104   Temp 98.2 F (36.8 C) (Oral)   Resp 16   SpO2 98%   Visual Acuity Right Eye Distance:   Left Eye Distance:   Bilateral Distance:    Right Eye Near:   Left Eye Near:    Bilateral Near:     Physical Exam Constitutional:      General: She is not in acute distress.    Appearance: She is obese. She is not ill-appearing.  HENT:     Head: Normocephalic and atraumatic.  Eyes:     General: No scleral icterus.    Pupils: Pupils are equal, round, and reactive to light.  Cardiovascular:     Rate and Rhythm: Normal rate and regular rhythm.     Heart sounds: No murmur. No gallop.      Comments: HR bedside 97 Pulmonary:     Effort: Pulmonary  effort is normal. No respiratory distress.     Breath sounds: No wheezing.  Abdominal:     Tenderness: There is no abdominal tenderness. There is no right CVA tenderness or left CVA tenderness.  Genitourinary:    Comments: Mild bilateral vulvar dermatitis without edema or open wound.  No TTP.  No yeastlike discharge.  Discharge is noted at introitus.  Swab performed by provider. Skin:    Coloration: Skin is not jaundiced or pale.  Neurological:     Mental Status: She is alert and oriented to person, place, and time.      UC Treatments / Results  Labs (all labs ordered are listed, but only abnormal results are displayed) Labs Reviewed  CERVICOVAGINAL ANCILLARY ONLY    EKG   Radiology No results found.  Procedures Procedures (including critical care time)  Medications Ordered in UC Medications - No data to display  Initial Impression / Assessment and Plan / UC Course  I have reviewed the triage vital signs and the nursing notes.  Pertinent labs & imaging results that were available during my care of the patient were reviewed by me and considered in my medical decision making (see chart for details).     Patient febrile, nontoxic in office today.  Exam consistent with mild dermatitis.  Patient does have discharge which I feel to be contributory: Cytology pending.  We will continue current therapies: Refill of nystatin powder sent per patient request. Final Clinical Impressions(s) / UC Diagnoses   Final diagnoses:  Acute vaginitis     Discharge Instructions     Keep area clean & dry. Apply powder daily.  Testing for chlamydia, gonorrhea, trichomonas is pending: please look for these results on the MyChart app/website.  We will notify you if you are positive and outline treatment at that time.  Important to avoid all forms of sexual intercourse (oral, vaginal, anal) with any/all partners for the next 7 days to avoid spreading/reinfecting. Any/all sexual partners  should be notified of testing/treatment today.  Return for persistent/worsening symptoms or if you develop fever, abdominal or pelvic pain, blood in your urine, or are re-exposed to an STI.  ED Prescriptions    Medication Sig Dispense Auth. Provider   nystatin (MYCOSTATIN/NYSTOP) powder Apply 1 application topically 3 (three) times daily. 60 g Hall-Potvin, [place], PA-C     PDMP not reviewed this encounter.   Hall-Potvin, [place], [place] 06/11/19 1730

## 2019-06-11 NOTE — ED Triage Notes (Signed)
Pt c/o rash to bilateral upper thigh x1 month. States has been treated for yeast x3 different doctors. States took ibuprofen today and it took the burn and swelling away.

## 2019-06-11 NOTE — Discharge Instructions (Addendum)
Keep area clean & dry. Apply powder daily.  Testing for chlamydia, gonorrhea, trichomonas is pending: please look for these results on the MyChart app/website.  We will notify you if you are positive and outline treatment at that time.  Important to avoid all forms of sexual intercourse (oral, vaginal, anal) with any/all partners for the next 7 days to avoid spreading/reinfecting. Any/all sexual partners should be notified of testing/treatment today.  Return for persistent/worsening symptoms or if you develop fever, abdominal or pelvic pain, blood in your urine, or are re-exposed to an STI.

## 2019-06-16 LAB — CERVICOVAGINAL ANCILLARY ONLY
Bacterial vaginitis: POSITIVE — AB
Chlamydia: NEGATIVE
Neisseria Gonorrhea: NEGATIVE
Trichomonas: POSITIVE — AB

## 2019-06-17 ENCOUNTER — Telehealth: Payer: Self-pay | Admitting: Emergency Medicine

## 2019-06-17 MED ORDER — METRONIDAZOLE 500 MG PO TABS: 500.0000 mg | ORAL_TABLET | Freq: Two times a day (BID) | ORAL | 0 refills | Status: DC

## 2019-06-17 NOTE — Telephone Encounter (Signed)
Patient called about positive results on MyChart for cervicovaginal swab.  Reviewed with APP on site today and will send Flagyl to pharmacy for patient pickup.  Patient needs to complete this antibiotic fully, and refrain from sexual interaction for 7 days.

## 2019-07-13 ENCOUNTER — Telehealth: Payer: Self-pay | Admitting: Family

## 2019-07-13 MED ORDER — CEPHALEXIN 500 MG PO CAPS: 500.0000 mg | ORAL_CAPSULE | Freq: Two times a day (BID) | ORAL | 2 refills | Status: DC

## 2019-07-13 NOTE — Telephone Encounter (Signed)
Patient with large boil in axilla area. Keflex 500 mg BID, # 28 with 2 RF's.RX for above e-scribed and sent to pharmacy on record  [facility] [place], [place] - [place] [address] [place] [address] Phone: [phone] Fax: [phone]

## 2019-07-23 DIAGNOSIS — Z0000 Encounter for general adult medical examination without abnormal findings: Secondary | ICD-10-CM | POA: Diagnosis not present

## 2019-07-23 DIAGNOSIS — Z30019 Encounter for initial prescription of contraceptives, unspecified: Secondary | ICD-10-CM | POA: Diagnosis not present

## 2019-07-27 DIAGNOSIS — H5213 Myopia, bilateral: Secondary | ICD-10-CM | POA: Diagnosis not present

## 2019-08-02 ENCOUNTER — Encounter (HOSPITAL_COMMUNITY): Payer: Self-pay

## 2019-08-02 ENCOUNTER — Other Ambulatory Visit: Payer: Self-pay

## 2019-08-02 ENCOUNTER — Ambulatory Visit (INDEPENDENT_AMBULATORY_CARE_PROVIDER_SITE_OTHER)
Admission: EM | Admit: 2019-08-02 | Discharge: 2019-08-02 | Disposition: A | Discharge status: Home / Self Care | Payer: 59 | Source: Home / Self Care

## 2019-08-02 DIAGNOSIS — Z8349 Family history of other endocrine, nutritional and metabolic diseases: Secondary | ICD-10-CM | POA: Diagnosis not present

## 2019-08-02 DIAGNOSIS — Z833 Family history of diabetes mellitus: Secondary | ICD-10-CM | POA: Diagnosis not present

## 2019-08-02 DIAGNOSIS — E876 Hypokalemia: Secondary | ICD-10-CM | POA: Diagnosis not present

## 2019-08-02 DIAGNOSIS — J069 Acute upper respiratory infection, unspecified: Secondary | ICD-10-CM | POA: Diagnosis not present

## 2019-08-02 DIAGNOSIS — R918 Other nonspecific abnormal finding of lung field: Secondary | ICD-10-CM | POA: Diagnosis not present

## 2019-08-02 DIAGNOSIS — Z8249 Family history of ischemic heart disease and other diseases of the circulatory system: Secondary | ICD-10-CM | POA: Diagnosis not present

## 2019-08-02 DIAGNOSIS — R0602 Shortness of breath: Secondary | ICD-10-CM | POA: Diagnosis not present

## 2019-08-02 DIAGNOSIS — U071 COVID-19: Secondary | ICD-10-CM | POA: Diagnosis not present

## 2019-08-02 DIAGNOSIS — J1282 Pneumonia due to coronavirus disease 2019: Secondary | ICD-10-CM | POA: Diagnosis not present

## 2019-08-02 DIAGNOSIS — Z8619 Personal history of other infectious and parasitic diseases: Secondary | ICD-10-CM | POA: Diagnosis not present

## 2019-08-02 DIAGNOSIS — Z98891 History of uterine scar from previous surgery: Secondary | ICD-10-CM | POA: Diagnosis not present

## 2019-08-02 MED ORDER — FLUTICASONE PROPIONATE 50 MCG/ACT NA SUSP: 1.0000 | Freq: Every day | NASAL | 0 refills | Status: DC

## 2019-08-02 MED ORDER — BENZONATATE 100 MG PO CAPS: 100.0000 mg | ORAL_CAPSULE | Freq: Three times a day (TID) | ORAL | 0 refills | Status: DC

## 2019-08-02 NOTE — Discharge Instructions (Addendum)
Take the tessalon for cough Use the flonase daily and begin taking claritin for allergy and runny nose  If shortness of breath gets worse or persists, it is accompanied by chest pain, productive cough with fever, please return for re-evaluation.  If you feel rapid heart rate or a racing heart please return or follow up with your primary care   If your Covid-19 test is positive, you will receive a phone call from [facility] regarding your results. Negative test results are not called. Both positive and negative results area always visible on MyChart. If you do not have a MyChart account, sign up instructions are in your discharge papers.   Persons who are directed to care for themselves at home may discontinue isolation under the following conditions:   At least 10 days have passed since symptom onset and  At least 24 hours have passed without running a fever (this means without the use of fever-reducing medications) and  Other symptoms have improved.  Persons infected with COVID-19 who never develop symptoms may discontinue isolation and other precautions 10 days after the date of their first positive COVID-19 test.

## 2019-08-02 NOTE — ED Triage Notes (Signed)
Pt is here with SOB & Cough that started Thursday states she is taking a new med(Vitamin D for 12wks). Pt has used Zyrtec, NyQuil to relieve discomfort.

## 2019-08-02 NOTE — ED Provider Notes (Signed)
[facility]    CSN: [id number] Arrival date & time: 08/02/19  3267      History   Chief Complaint Chief Complaint  Patient presents with  . Shortness of Breath  . Cough    HPI Kristin Lang is a 34 y.o. female.   Patient presents for evaluation of fatigue, nasal congestion and cough.  She reports symptoms started with general fatigue and a" heaviness on her body" about 4 days ago.  She reports this has progressed to having some nasal congestion with sneezing.  She reports clear nasal discharge frequently.  She believes that some of this has been running down her throat.  Denies sore throat.  She has also had a cough but it has been nonproductive.  The cough has been intermittent.  She reports some intermittent shortness of breath that resides very quickly.  Denies chest pain.  She reports this shortness of breath will occur after coughing or with a deep breath occasionally.  She has no history of asthma or lung disease.  She had some nausea early on for the first 2 days however no vomiting.  The nausea has mostly resided.  She reports having good appetite however after starting to eat she loses her appetite.  However she is not experiencing nausea with this.  She denies diarrhea.  Denies change in taste or smell.  Denies headache.  She denies any chest pain or sensation of fast heart rate.  She reports recently having a checkup with her primary care and blood work done on 07/23/2019.  She reports most of this was all normal.  She denies any known sick contacts.  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  Denies weight gain or weight loss.  Denies significant change in appetite.  Denies tremor.     Past Medical History:  Diagnosis Date  . Abnormal Pap smear   . Chlamydia   . Gonorrhea   . History of elective abortion     Patient Active Problem List   Diagnosis Date Noted  . ASCUS with positive  high risk HPV 07/05/2011    Past Surgical History:  Procedure Laterality Date  . CESAREAN SECTION      OB History    Gravida  2   Para  1   Term  1   Preterm      AB  1   Living  1     SAB      TAB  1   Ectopic      Multiple      Live Births               Home Medications    Prior to Admission medications   Medication Sig Start Date End Date Taking? Authorizing Provider  cephALEXin (KEFLEX) 500 MG capsule Take by mouth. 07/13/19  Yes [provider]  clotrimazole-betamethasone (LOTRISONE) cream APPLY EXTERNALLY TO THE AFFECTED AREA THREE TIMES DAILY 05/28/19  Yes [provider]  ergocalciferol (VITAMIN D2) 1.25 MG (50000 UT) capsule Take by mouth. 07/26/19 10/12/19 Yes [provider]  hydrOXYzine (ATARAX/VISTARIL) 50 MG tablet Take one tablet at night. 05/05/18  Yes [provider]  loratadine (CLARITIN) 10 MG tablet Take by mouth. 05/05/18  Yes [provider]  medroxyPROGESTERone (DEPO-PROVERA) 150 MG/ML injection Inject into the muscle. 02/17/19  Yes [provider]  benzonatate (TESSALON) 100 MG capsule Take 1 capsule (100 mg total) by  mouth every 8 (eight) hours. 08/02/19   Heyli Min, Veryl Speak, PA-C  cephALEXin (KEFLEX) 500 MG capsule Take 1 capsule (500 mg total) by mouth 2 (two) times daily. 07/13/19   Paretta-Leahey, Miachel Roux, NP  clotrimazole-betamethasone (LOTRISONE) cream Apply topically 3 (three) times daily. 05/28/19   Paretta-Leahey, Miachel Roux, NP  clotrimazole-betamethasone (LOTRISONE) cream     [provider]  cyclobenzaprine (FLEXERIL) 10 MG tablet Take 1 tablet (10 mg total) by mouth 2 (two) times daily as needed for muscle spasms. 04/03/19   Wallis Bamberg, PA-C  fluconazole (DIFLUCAN) 150 MG tablet     [provider]  fluconazole (DIFLUCAN) 150 MG tablet     [provider]  fluticasone (FLONASE) 50 MCG/ACT nasal spray Place 1 spray into both nostrils daily. 08/02/19 09/01/19  Keyli Duross,  Veryl Speak, PA-C  ibuprofen (ADVIL,MOTRIN) 800 MG tablet Take 1 tablet (800 mg total) by mouth 3 (three) times daily. 08/21/15   Cheri Fowler, PA-C  medroxyPROGESTERone (DEPO-PROVERA) 150 MG/ML injection Inject 150 mg into the muscle every 3 (three) months.    [provider]  metroNIDAZOLE (FLAGYL) 500 MG tablet Take 1 tablet (500 mg total) by mouth 2 (two) times daily. 06/17/19   Hall-Potvin, [place], PA-C  nystatin (MYCOSTATIN/NYSTOP) powder Apply 1 application topically 3 (three) times daily. 06/11/19   Hall-Potvin, [place], PA-C  nystatin (NYSTATIN) powder     [provider]  nystatin cream (MYCOSTATIN) Apply 1 application topically 3 (three) times daily. 06/06/19   [provider]  nystatin cream (MYCOSTATIN)     [provider]  Vitamin D, Ergocalciferol, (DRISDOL) 1.25 MG (50000 UNIT) CAPS capsule  07/26/19   [provider]    Family History Family History  Problem Relation Age of Onset  . Thyroid nodules Mother   . Prostate cancer Father   . Hypertension Maternal Grandmother   . Diabetes Maternal Grandmother   . Hypertension Maternal Grandfather   . Diabetes Maternal Grandfather     Social History Social History   Tobacco Use  . Smoking status: Never Smoker  . Smokeless tobacco: Never Used  Substance Use Topics  . Alcohol use: Yes    Comment: daily for last couple weeks  . Drug use: No     Allergies   Doxycycline   Review of Systems Review of Systems  Per HPI Physical Exam Triage Vital Signs ED Triage Vitals  Enc Vitals Group     BP      Pulse      Resp      Temp      Temp src      SpO2      Weight      Height      Head Circumference      Peak Flow      Pain Score      Pain Loc      Pain Edu?      Excl. in GC?    No data found.  Updated Vital Signs BP 130/71 (BP Location: Left Arm)   Pulse (!) 113   Temp 98.9 F (37.2 C) (Oral)   Resp 18   Wt 215 lb 12.8 oz (97.9 kg)   SpO2 100%   BMI 39.47 kg/m    Visual Acuity Right Eye Distance:   Left Eye Distance:   Bilateral Distance:    Right Eye Near:   Left Eye Near:    Bilateral Near:     Physical Exam Vitals and nursing note reviewed.  Constitutional:      General: She is not in acute distress.    Appearance: She is well-developed. She is not ill-appearing.  HENT:     Head: Normocephalic and atraumatic.     Mouth/Throat:     Mouth: Mucous membranes are moist.     Comments: Mild postnasal drip.  No erythema or exudates. Eyes:     Extraocular Movements: Extraocular movements intact.     Conjunctiva/sclera: Conjunctivae normal.     Pupils: Pupils are equal, round, and reactive to light.  Cardiovascular:     Rate and Rhythm: Regular rhythm. Tachycardia present.     Heart sounds: No murmur.  Pulmonary:     Effort: Pulmonary effort is normal. No respiratory distress.     Breath sounds: Normal breath sounds. No decreased breath sounds, wheezing, rhonchi or rales.  Abdominal:     Palpations: Abdomen is soft.     Tenderness: There is no abdominal tenderness.  Musculoskeletal:     Cervical back: Normal range of motion and neck supple.  Lymphadenopathy:     Cervical: No cervical adenopathy.  Skin:    General: Skin is warm and dry.  Neurological:     Mental Status: She is alert.      UC Treatments / Results  Labs (all labs ordered are listed, but only abnormal results are displayed) Labs Reviewed  SARS CORONAVIRUS 2 (TAT 6-24 HRS) - Abnormal; Notable for the following components:      Result Value   SARS Coronavirus 2 POSITIVE (*)    All other components within normal limits    EKG   Radiology No results found.  Procedures Procedures (including critical care time)  Medications Ordered in UC Medications - No data to display  Initial Impression / Assessment and Plan / UC Course  I have reviewed the triage vital signs and the nursing notes.  Pertinent labs & imaging results that were available during my care  of the patient were reviewed by me and considered in my medical decision making (see chart for details).     #Viral URI with cough-COVID-19 Patient is a 34 year old who presented with viral syndrome with upper respiratory symptoms.  Covid PCR results returned prior to note filing and was positive for COVID-19.  This largely explains her symptoms.  With exception of elevated heart rate in clinic patient was stable from a vital sign standpoint.  Respiratory status reassuring and lung exam benign.  Started symptomatic care.  Discussed follow-up and return precautions with patient she verbalized understanding.   Final Clinical Impressions(s) / UC Diagnoses   Final diagnoses:  Viral URI with cough     Discharge Instructions     Take the tessalon for cough Use the flonase daily and begin taking claritin for allergy and runny nose  If shortness of breath gets worse or persists, it is accompanied by chest pain, productive cough with fever, please return for re-evaluation.  If you feel rapid heart rate or a racing heart please return or follow up with your primary care   If your Covid-19 test is positive, you will receive a phone call from [facility] regarding your results. Negative test results are not called. Both positive and negative results area always visible on MyChart. If you do not have a MyChart account, sign up instructions are in your discharge papers.   Persons who are directed to care for themselves at home may discontinue isolation under the following conditions:  . At least 10 days have passed  since symptom onset and . At least 24 hours have passed without running a fever (this means without the use of fever-reducing medications) and . Other symptoms have improved.  Persons infected with COVID-19 who never develop symptoms may discontinue isolation and other precautions 10 days after the date of their first positive COVID-19 test.       ED Prescriptions     Medication Sig Dispense Auth. Provider   fluticasone (FLONASE) 50 MCG/ACT nasal spray Place 1 spray into both nostrils daily. 11 mL Bianco Cange, Veryl Speak, PA-C   benzonatate (TESSALON) 100 MG capsule Take 1 capsule (100 mg total) by mouth every 8 (eight) hours. 21 capsule Breylan Lefevers, Veryl Speak, PA-C     PDMP not reviewed this encounter.   Hermelinda Medicus, PA-C 08/03/19 [phone]

## 2019-08-03 LAB — SARS CORONAVIRUS 2 (TAT 6-24 HRS): SARS Coronavirus 2: POSITIVE — AB

## 2019-08-04 ENCOUNTER — Inpatient Hospital Stay (HOSPITAL_COMMUNITY)
Admission: EM | Admit: 2019-08-04 | Discharge: 2019-08-06 | DRG: 177 | Disposition: A | Discharge status: Home / Self Care | Payer: 59 | Attending: Family Medicine | Admitting: Family Medicine

## 2019-08-04 ENCOUNTER — Emergency Department (HOSPITAL_COMMUNITY): Payer: 59

## 2019-08-04 ENCOUNTER — Other Ambulatory Visit: Payer: Self-pay

## 2019-08-04 ENCOUNTER — Telehealth: Payer: Self-pay | Admitting: Unknown Physician Specialty

## 2019-08-04 ENCOUNTER — Encounter (HOSPITAL_COMMUNITY): Payer: Self-pay | Admitting: Emergency Medicine

## 2019-08-04 DIAGNOSIS — R0602 Shortness of breath: Secondary | ICD-10-CM | POA: Diagnosis not present

## 2019-08-04 DIAGNOSIS — Z793 Long term (current) use of hormonal contraceptives: Secondary | ICD-10-CM

## 2019-08-04 DIAGNOSIS — Z98891 History of uterine scar from previous surgery: Secondary | ICD-10-CM | POA: Diagnosis not present

## 2019-08-04 DIAGNOSIS — Z833 Family history of diabetes mellitus: Secondary | ICD-10-CM | POA: Diagnosis not present

## 2019-08-04 DIAGNOSIS — Z8349 Family history of other endocrine, nutritional and metabolic diseases: Secondary | ICD-10-CM | POA: Diagnosis not present

## 2019-08-04 DIAGNOSIS — E876 Hypokalemia: Secondary | ICD-10-CM | POA: Diagnosis present

## 2019-08-04 DIAGNOSIS — Z881 Allergy status to other antibiotic agents status: Secondary | ICD-10-CM | POA: Diagnosis not present

## 2019-08-04 DIAGNOSIS — Z8619 Personal history of other infectious and parasitic diseases: Secondary | ICD-10-CM | POA: Diagnosis not present

## 2019-08-04 DIAGNOSIS — U071 COVID-19: Secondary | ICD-10-CM | POA: Diagnosis not present

## 2019-08-04 DIAGNOSIS — Z79899 Other long term (current) drug therapy: Secondary | ICD-10-CM

## 2019-08-04 DIAGNOSIS — Z8042 Family history of malignant neoplasm of prostate: Secondary | ICD-10-CM

## 2019-08-04 DIAGNOSIS — Z8249 Family history of ischemic heart disease and other diseases of the circulatory system: Secondary | ICD-10-CM | POA: Diagnosis not present

## 2019-08-04 DIAGNOSIS — R918 Other nonspecific abnormal finding of lung field: Secondary | ICD-10-CM | POA: Diagnosis not present

## 2019-08-04 DIAGNOSIS — J1282 Pneumonia due to coronavirus disease 2019: Secondary | ICD-10-CM | POA: Diagnosis not present

## 2019-08-04 LAB — COMPREHENSIVE METABOLIC PANEL
ALT: 42 U/L (ref 0–44)
AST: 32 U/L (ref 15–41)
Albumin: 3.8 g/dL (ref 3.5–5.0)
Alkaline Phosphatase: 67 U/L (ref 38–126)
Anion gap: 13 (ref 5–15)
BUN: 13 mg/dL (ref 6–20)
CO2: 21 mmol/L — ABNORMAL LOW (ref 22–32)
Calcium: 8.6 mg/dL — ABNORMAL LOW (ref 8.9–10.3)
Chloride: 106 mmol/L (ref 98–111)
Creatinine, Ser: 0.99 mg/dL (ref 0.44–1.00)
GFR calc Af Amer: >60 mL/min (ref >60)
GFR calc non Af Amer: >60 mL/min (ref >60)
Glucose, Bld: 104 mg/dL — ABNORMAL HIGH (ref 70–99)
Potassium: 3.2 mmol/L — ABNORMAL LOW (ref 3.5–5.1)
Sodium: 140 mmol/L (ref 135–145)
Total Bilirubin: 0.7 mg/dL (ref 0.3–1.2)
Total Protein: 8.6 g/dL — ABNORMAL HIGH (ref 6.5–8.1)

## 2019-08-04 LAB — CBC WITH DIFFERENTIAL/PLATELET
Abs Immature Granulocytes: 0.06 10*3/uL (ref 0.00–0.07)
Basophils Absolute: 0 10*3/uL (ref 0.0–0.1)
Basophils Relative: 0 %
Eosinophils Absolute: 0 10*3/uL (ref 0.0–0.5)
Eosinophils Relative: 0 %
HCT: 49.1 % — ABNORMAL HIGH (ref 36.0–46.0)
Hemoglobin: 16.6 g/dL — ABNORMAL HIGH (ref 12.0–15.0)
Immature Granulocytes: 1 %
Lymphocytes Relative: 24 %
Lymphs Abs: 2.5 10*3/uL (ref 0.7–4.0)
MCH: 30.6 pg (ref 26.0–34.0)
MCHC: 33.8 g/dL (ref 30.0–36.0)
MCV: 90.6 fL (ref 80.0–100.0)
Monocytes Absolute: 0.5 10*3/uL (ref 0.1–1.0)
Monocytes Relative: 5 %
Neutro Abs: 7.5 10*3/uL (ref 1.7–7.7)
Neutrophils Relative %: 70 %
Platelets: 244 10*3/uL (ref 150–400)
RBC: 5.42 MIL/uL — ABNORMAL HIGH (ref 3.87–5.11)
RDW: 12.6 % (ref 11.5–15.5)
WBC: 10.6 10*3/uL — ABNORMAL HIGH (ref 4.0–10.5)
nRBC: 0 % (ref 0.0–0.2)

## 2019-08-04 LAB — PROCALCITONIN: Procalcitonin: <0.1 ng/mL

## 2019-08-04 LAB — C-REACTIVE PROTEIN: CRP: 13 mg/dL — ABNORMAL HIGH (ref <1.0)

## 2019-08-04 LAB — D-DIMER, QUANTITATIVE: D-Dimer, Quant: 0.42 ug/mL-FEU (ref 0.00–0.50)

## 2019-08-04 LAB — LACTATE DEHYDROGENASE: LDH: 254 U/L — ABNORMAL HIGH (ref 98–192)

## 2019-08-04 LAB — TRIGLYCERIDES: Triglycerides: 63 mg/dL (ref <150)

## 2019-08-04 LAB — I-STAT BETA HCG BLOOD, ED (MC, WL, AP ONLY): I-stat hCG, quantitative: <5 m[IU]/mL (ref <5)

## 2019-08-04 LAB — FERRITIN: Ferritin: 635 ng/mL — ABNORMAL HIGH (ref 11–307)

## 2019-08-04 LAB — FIBRINOGEN: Fibrinogen: 672 mg/dL — ABNORMAL HIGH (ref 210–475)

## 2019-08-04 LAB — LACTIC ACID, PLASMA: Lactic Acid, Venous: 1.3 mmol/L (ref 0.5–1.9)

## 2019-08-04 MED ORDER — ACETAMINOPHEN 325 MG PO TABS
650.0000 mg | ORAL_TABLET | Freq: Once | ORAL | Status: AC
  Administered 2019-08-04: 650 mg via ORAL
  Filled 2019-08-04: qty 2

## 2019-08-04 MED ORDER — SODIUM CHLORIDE 0.9 % IV SOLN
100.0000 mg | Freq: Once | INTRAVENOUS | Status: AC
  Administered 2019-08-05: 100 mg via INTRAVENOUS
  Filled 2019-08-04: qty 20

## 2019-08-04 MED ORDER — SODIUM CHLORIDE 0.9 % IV SOLN: 100.0000 mg | Freq: Every day | INTRAVENOUS | Status: DC

## 2019-08-04 MED ORDER — SODIUM CHLORIDE 0.9 % IV SOLN
500.0000 mg | INTRAVENOUS | Status: DC
  Administered 2019-08-05: 500 mg via INTRAVENOUS
  Filled 2019-08-04 (×2): qty 500

## 2019-08-04 MED ORDER — SODIUM CHLORIDE 0.9 % IV SOLN
1.0000 g | INTRAVENOUS | Status: DC
  Administered 2019-08-04: 1 g via INTRAVENOUS
  Filled 2019-08-04 (×2): qty 10

## 2019-08-04 MED ORDER — DEXAMETHASONE SODIUM PHOSPHATE 10 MG/ML IJ SOLN
10.0000 mg | Freq: Once | INTRAMUSCULAR | Status: AC
  Administered 2019-08-04: 10 mg via INTRAVENOUS
  Filled 2019-08-04: qty 1

## 2019-08-04 MED ORDER — SODIUM CHLORIDE 0.9 % IV BOLUS
500.0000 mL | Freq: Once | INTRAVENOUS | Status: AC
  Administered 2019-08-04: 500 mL via INTRAVENOUS

## 2019-08-04 NOTE — Telephone Encounter (Signed)
  I connected by phone with Lester [place] on 08/04/2019 at 8:42 AM to discuss the potential use of an new treatment for mild to moderate COVID-19 viral infection in non-hospitalized patients.  This patient is a 34 y.o. female that meets the FDA criteria for Emergency Use Authorization of bamlanivimab/etesevimab or casirivimab/imdevimab.  Has a (+) direct SARS-CoV-2 viral test result  Has mild or moderate COVID-19   Is ? 34 years of age and weighs ? 40 kg  Is NOT hospitalized due to COVID-19  Is NOT requiring oxygen therapy or requiring an increase in baseline oxygen flow rate due to COVID-19  Is within 10 days of symptom onset  Has at least one of the high risk factor(s) for progression to severe COVID-19 and/or hospitalization as defined in EUA.  Specific high risk criteria : BMI >/= 35   I have spoken and communicated the following to the patient or parent/caregiver:  1. FDA has authorized the emergency use of bamlanivimab/etesevimab and casirivimab\imdevimab for the treatment of mild to moderate COVID-19 in adults and pediatric patients with positive results of direct SARS-CoV-2 viral testing who are 50 years of age and older weighing at least 40 kg, and who are at high risk for progressing to severe COVID-19 and/or hospitalization.  2. The significant known and potential risks and benefits of bamlanivimab/etesevimab and casirivimab\imdevimab, and the extent to which such potential risks and benefits are unknown.  3. Information on available alternative treatments and the risks and benefits of those alternatives, including clinical trials.  4. Patients treated with bamlanivimab/etesevimab and casirivimab\imdevimab should continue to self-isolate and use infection control measures (e.g., wear mask, isolate, social distance, avoid sharing personal items, clean and disinfect "high touch" surfaces, and frequent handwashing) according to CDC guidelines.   5. The patient or  parent/caregiver has the option to accept or refuse bamlanivimab/etesevimab or casirivimab\imdevimab .  After reviewing this information with the patient,would like information and think about it. Mychart information sent.    Gabriel Cirri 08/04/2019 8:42 AM

## 2019-08-04 NOTE — H&P (Signed)
History and Physical   Kristin Lang OJJ:[id number] DOB: 10/08/1985 DOA: 08/04/2019  Referring MD/NP/PA: Dr. Lockie Mola  PCP: Jordan Hawks, PA-C   Outpatient Specialists: None  Patient coming from: Home  Chief Complaint: Shortness of breath and cough  HPI: Kristin Lang is a 34 y.o. female with medical history significant of mainly gynecologic history of morbid obesity who was diagnosed with COVID-19 2 days ago presented to the ER with progressive shortness of breath fever and cough.  Patient has not been vaccinated.  She came in with the complaint that symptoms are getting worse at home.  She is also beginning to have some mild dyspnea with exertion.  Patient was seen and evaluated in the ER.  Most inflammatory markers are only minimally elevated.  Chest x-ray shows an emergent infiltrate.  She is persistently tachycardic despite fluids in the ER so she is being admitted with symptomatic COVID-19 infection.  Patient appears to have early pneumonia.  Not hypoxic at the moment.  She will be treated for COVID-19 pneumonia in the early stages..  ED Course: Temperature 100.3 blood pressure 112/83 pulse 112 respiratory of 27 oxygen 93% room air.  Sodium 140 potassium 3.2 chloride 106 CO2 21 glucose 104 BUN 13 creatinine 0.99 and calcium 8.6.  LDH is 254 triglyceride 63 ferritin was 635 CRP of 13 lactic acid 1.3.  White count is 10.6 hemoglobin 16.6 and platelets 244.  D-dimer 0.42 and fibrinogen 672.  Pregnancy test is negative.  Chest x-ray showed patchy left lower lobe infiltrates and pneumonia.  Patient being admitted for inpatient treatment.  Review of Systems: As per HPI otherwise 10 point review of systems negative.    Past Medical History:  Diagnosis Date  . Abnormal Pap smear   . Chlamydia   . Gonorrhea   . History of elective abortion     Past Surgical History:  Procedure Laterality Date  . CESAREAN SECTION       reports that she has never smoked. She has never used  smokeless tobacco. She reports current alcohol use. She reports that she does not use drugs.  Allergies  Allergen Reactions  . Doxycycline Rash    Rash/bumps    Family History  Problem Relation Age of Onset  . Thyroid nodules Mother   . Prostate cancer Father   . Hypertension Maternal Grandmother   . Diabetes Maternal Grandmother   . Hypertension Maternal Grandfather   . Diabetes Maternal Grandfather      Prior to Admission medications   Medication Sig Start Date End Date Taking? Authorizing Provider  acetaminophen (TYLENOL) 500 MG tablet Take 500-1,000 mg by mouth every 6 (six) hours as needed for fever or headache.   Yes [provider]  benzonatate (TESSALON) 100 MG capsule Take 1 capsule (100 mg total) by mouth every 8 (eight) hours. 08/02/19  Yes Darr, Veryl Speak, PA-C  cephALEXin (KEFLEX) 500 MG capsule Take 1 capsule (500 mg total) by mouth 2 (two) times daily. Patient taking differently: Take 500 mg by mouth 2 (two) times daily as needed (boil flareup).  07/13/19  Yes Paretta-Leahey, Miachel Roux, NP  medroxyPROGESTERone (DEPO-PROVERA) 150 MG/ML injection Inject 150 mg into the muscle every 3 (three) months.   Yes [provider]  clotrimazole-betamethasone (LOTRISONE) cream Apply topically 3 (three) times daily. Patient not taking: Reported on 08/04/2019 05/28/19   Paretta-Leahey, Miachel Roux, NP  cyclobenzaprine (FLEXERIL) 10 MG tablet Take 1 tablet (10 mg total) by mouth 2 (two) times daily as needed  for muscle spasms. Patient not taking: Reported on 08/04/2019 04/03/19   Wallis Bamberg, PA-C  fluticasone [facility]) 50 MCG/ACT nasal spray Place 1 spray into both nostrils daily. 08/02/19 09/01/19  Darr, Veryl Speak, PA-C  ibuprofen (ADVIL,MOTRIN) 800 MG tablet Take 1 tablet (800 mg total) by mouth 3 (three) times daily. Patient not taking: Reported on 08/04/2019 08/21/15   Cheri Fowler, PA-C  metroNIDAZOLE (FLAGYL) 500 MG tablet Take 1 tablet (500 mg total) by mouth 2 (two) times  daily. Patient not taking: Reported on 08/04/2019 06/17/19   Hall-Potvin, [place], PA-C  nystatin (MYCOSTATIN/NYSTOP) powder Apply 1 application topically 3 (three) times daily. Patient not taking: Reported on 08/04/2019 06/11/19   Shea Evans, [place]    Physical Exam: Vitals:   08/04/19 2200 08/04/19 2230 08/04/19 2303 08/04/19 2330  BP: 99/66 101/65 108/73 104/79  Pulse: 100 100 99 99  Resp: 20 (!) 23 (!) 27 (!) 22  Temp:      TempSrc:      SpO2: 95% 97% 96% 97%      Constitutional: Obese, stable, no acute distress Vitals:   08/04/19 2200 08/04/19 2230 08/04/19 2303 08/04/19 2330  BP: 99/66 101/65 108/73 104/79  Pulse: 100 100 99 99  Resp: 20 (!) 23 (!) 27 (!) 22  Temp:      TempSrc:      SpO2: 95% 97% 96% 97%   Eyes: PERRL, lids and conjunctivae normal ENMT: Mucous membranes are moist. Posterior pharynx clear of any exudate or lesions.Normal dentition.  Neck: normal, supple, no masses, no thyromegaly Respiratory: Decreased air entry at the bases with crackles, no expiratory wheezing normal respiratory effort. No accessory muscle use.  Cardiovascular: Regular rate and rhythm, no murmurs / rubs / gallops. No extremity edema. 2+ pedal pulses. No carotid bruits.  Abdomen: no tenderness, no masses palpated. No hepatosplenomegaly. Bowel sounds positive.  Musculoskeletal: no clubbing / cyanosis. No joint deformity upper and lower extremities. Good ROM, no contractures. Normal muscle tone.  Skin: no rashes, lesions, ulcers. No induration Neurologic: CN 2-12 grossly intact. Sensation intact, DTR normal. Strength 5/5 in all 4.  Psychiatric: Normal judgment and insight. Alert and oriented x 3. Normal mood.     Labs on Admission: I have personally reviewed following labs and imaging studies  CBC: Recent Labs  Lab 08/04/19 1826  WBC 10.6*  NEUTROABS 7.5  HGB 16.6*  HCT 49.1*  MCV 90.6  PLT 244   Basic Metabolic Panel: Recent Labs  Lab 08/04/19 1826  NA 140  K  3.2*  CL 106  CO2 21*  GLUCOSE 104*  BUN 13  CREATININE 0.99  CALCIUM 8.6*   GFR: CrCl cannot be calculated (Unknown ideal weight.). Liver Function Tests: Recent Labs  Lab 08/04/19 1826  AST 32  ALT 42  ALKPHOS 67  BILITOT 0.7  PROT 8.6*  ALBUMIN 3.8   No results for input(s): LIPASE, AMYLASE in the last 168 hours. No results for input(s): AMMONIA in the last 168 hours. Coagulation Profile: No results for input(s): INR, PROTIME in the last 168 hours. Cardiac Enzymes: No results for input(s): CKTOTAL, CKMB, CKMBINDEX, TROPONINI in the last 168 hours. BNP (last 3 results) No results for input(s): PROBNP in the last 8760 hours. HbA1C: No results for input(s): HGBA1C in the last 72 hours. CBG: No results for input(s): GLUCAP in the last 168 hours. Lipid Profile: Recent Labs    08/04/19 1826  TRIG 63   Thyroid Function Tests: No results for input(s): TSH, T4TOTAL, FREET4,  T3FREE, THYROIDAB in the last 72 hours. Anemia Panel: Recent Labs    08/04/19 1826  FERRITIN 635*   Urine analysis:    Component Value Date/Time   LABSPEC 1.020 07/03/2013 2004   PHURINE 7.5 07/03/2013 2004   GLUCOSEU NEGATIVE 07/03/2013 2004   HGBUR NEGATIVE 07/03/2013 2004   BILIRUBINUR NEGATIVE 07/03/2013 2004   [place] NEGATIVE 07/03/2013 2004   PROTEINUR NEGATIVE 07/03/2013 2004   UROBILINOGEN 2.0 (H) 07/03/2013 2004   NITRITE NEGATIVE 07/03/2013 2004   LEUKOCYTESUR SMALL (A) 07/03/2013 2004   Sepsis Labs: @LABRCNTIP (procalcitonin:4,lacticidven:4) ) Recent Results (from the past 240 hour(s))  SARS CORONAVIRUS 2 (TAT 6-24 HRS) Nasopharyngeal Nasopharyngeal Swab     Status: Abnormal   Collection Time: 08/02/19  8:40 AM   Specimen: Nasopharyngeal Swab  Result Value Ref Range Status   SARS Coronavirus 2 POSITIVE (A) NEGATIVE Final    Comment: EMAILED [place] ON 08/03/2018 BY MESSAN H.  (NOTE) SARS-CoV-2 target nucleic acids are DETECTED. The SARS-CoV-2 RNA is generally  detectable in upper and lower respiratory specimens during the acute phase of infection. Positive results are indicative of the presence of SARS-CoV-2 RNA. Clinical correlation with patient history and other diagnostic information is  necessary to determine patient infection status. Positive results do not rule out bacterial infection or co-infection with other viruses.  The expected result is Negative. Fact Sheet for Patients: SugarRoll.be Fact Sheet for Healthcare Providers: https://www.woods-mathews.com/ This test is not yet approved or cleared by the [place] FDA and  has been authorized for detection and/or diagnosis of SARS-CoV-2 by FDA under an Emergency Use Authorization (EUA). This EUA will remain  in effect (meaning this test can be used) for the duration of the COVID-19 declar ation under Section 564(b)(1) of the Act, 21 U.S.C. section 360bbb-3(b)(1), unless the authorization is terminated or revoked sooner. Performed at [facility], [place] [address], [place], [place] [address]      Radiological Exams on Admission: DG Chest Portable 1 View  Result Date: 08/04/2019 CLINICAL DATA:  COVID EXAM: PORTABLE CHEST 1 VIEW COMPARISON:  12/13/2012 FINDINGS: Low lung volumes. Patchy left lower lobe airspace opacity. Right lung clear. Heart is normal size. No effusions or acute bony abnormality. IMPRESSION: Low lung volumes. Patchy left lower lobe infiltrate/pneumonia. Electronically Signed   By: Rolm Baptise M.D.   On: 08/04/2019 19:08     Assessment/Plan Principal Problem:   Pneumonia due to COVID-19 virus Active Problems:   Hypokalemia     #1 COVID-19 pneumonia symptomatic: Patient will be admitted and initiated on treatment.  We will start remdesivir, Decadron, Rocephin and Zithromax, multivitamins, oxygen, consider Actemra as patient is having CRP now at 13.  Oxygen available although patient is not yet hypoxic on room  air.  #2 hypokalemia: Replete potassium.  #3 obesity: Dietary counseling   DVT prophylaxis: Lovenox Code Status: Full code Family Communication: No family at bedside Disposition Plan: Home Consults called: None Admission status: Inpatient  Severity of Illness: The appropriate patient status for this patient is INPATIENT. Inpatient status is judged to be reasonable and necessary in order to provide the required intensity of service to ensure the patient's safety. The patient's presenting symptoms, physical exam findings, and initial radiographic and laboratory data in the context of their chronic comorbidities is felt to place them at high risk for further clinical deterioration. Furthermore, it is not anticipated that the patient will be medically stable for discharge from the hospital within 2 midnights of admission. The  following factors support the patient status of inpatient.   " The patient's presenting symptoms include shortness of breath and cough. " The worrisome physical exam findings include bilateral crackles. " The initial radiographic and laboratory data are worrisome because of x-ray findings of pneumonia with elevated markers. " The chronic co-morbidities include none.   * I certify that at the point of admission it is my clinical judgment that the patient will require inpatient hospital care spanning beyond 2 midnights from the point of admission due to high intensity of service, high risk for further deterioration and high frequency of surveillance required.Lonia Blood MD [facility] Pager [phone]  If 7PM-7AM, please contact night-coverage [email or web address] Password [facility]  08/05/2019, 12:02 AM

## 2019-08-04 NOTE — ED Provider Notes (Signed)
[facility]-EMERGENCY DEPT Provider Note   CSN: [id number] Arrival date & time: 08/04/19  1748     History Chief Complaint  Patient presents with  . Covid positive  . Shortness of Breath    Kristin Lang is a 34 y.o. female.  The history is provided by the patient.  Shortness of Breath Severity:  Moderate Onset quality:  Gradual Timing:  Constant Progression:  Worsening Chronicity:  New Context: URI (covid)   Relieved by:  Nothing Worsened by:  Activity Associated symptoms: cough and fever   Associated symptoms: no abdominal pain, no chest pain, no ear pain, no rash, no sore throat and no vomiting   Risk factors: no hx of PE/DVT        Past Medical History:  Diagnosis Date  . Abnormal Pap smear   . Chlamydia   . Gonorrhea   . History of elective abortion     Patient Active Problem List   Diagnosis Date Noted  . 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 CESAREAN SECTION       OB History    Gravida  2   Para  1   Term  1   Preterm      AB  1   Living  1     SAB      TAB  1   Ectopic      Multiple      Live Births              Family History  Problem Relation Age of Onset  . Thyroid nodules Mother   . Prostate cancer Father   . Hypertension Maternal Grandmother   . Diabetes Maternal Grandmother   . Hypertension Maternal Grandfather   . Diabetes Maternal Grandfather     Social History   Tobacco Use  . Smoking status: Never Smoker  . Smokeless tobacco: Never Used  Substance Use Topics  . Alcohol use: Yes    Comment: daily for last couple weeks  . Drug use: No    Home Medications Prior to Admission medications   Medication Sig Start Date End Date Taking? Authorizing Provider  benzonatate (TESSALON) 100 MG capsule Take 1 capsule (100 mg total) by mouth every 8 (eight) hours. 08/02/19   Darr, Veryl Speak, PA-C  cephALEXin (KEFLEX) 500 MG  capsule Take 1 capsule (500 mg total) by mouth 2 (two) times daily. 07/13/19   Paretta-Leahey, Miachel Roux, NP  cephALEXin (KEFLEX) 500 MG capsule Take by mouth. 07/13/19   [provider]  clotrimazole-betamethasone (LOTRISONE) cream Apply topically 3 (three) times daily. 05/28/19   Paretta-Leahey, Miachel Roux, NP  clotrimazole-betamethasone (LOTRISONE) cream     [provider]  clotrimazole-betamethasone (LOTRISONE) cream APPLY EXTERNALLY TO THE AFFECTED AREA THREE TIMES DAILY 05/28/19   [provider]  cyclobenzaprine (FLEXERIL) 10 MG tablet Take 1 tablet (10 mg total) by mouth 2 (two) times daily as needed for muscle spasms. 04/03/19   Wallis Bamberg, PA-C  ergocalciferol (VITAMIN D2) 1.25 MG (50000 UT) capsule Take by mouth. 07/26/19 10/12/19  [provider]  fluconazole (DIFLUCAN) 150 MG tablet     [provider]  fluconazole (DIFLUCAN) 150 MG tablet     [provider]  fluticasone (FLONASE) 50 MCG/ACT nasal spray Place 1 spray into both nostrils daily. 08/02/19 09/01/19  Darr, Veryl Speak, PA-C  hydrOXYzine (ATARAX/VISTARIL) 50 MG tablet Take one tablet at  night. 05/05/18   [provider]  ibuprofen (ADVIL,MOTRIN) 800 MG tablet Take 1 tablet (800 mg total) by mouth 3 (three) times daily. 08/21/15   Cheri Fowler, PA-C  loratadine (CLARITIN) 10 MG tablet Take by mouth. 05/05/18   [provider]  medroxyPROGESTERone (DEPO-PROVERA) 150 MG/ML injection Inject 150 mg into the muscle every 3 (three) months.    [provider]  medroxyPROGESTERone (DEPO-PROVERA) 150 MG/ML injection Inject into the muscle. 02/17/19   [provider]  metroNIDAZOLE (FLAGYL) 500 MG tablet Take 1 tablet (500 mg total) by mouth 2 (two) times daily. 06/17/19   Hall-Potvin, [place], PA-C  nystatin (MYCOSTATIN/NYSTOP) powder Apply 1 application topically 3 (three) times daily. 06/11/19   Hall-Potvin, [place], PA-C  nystatin (NYSTATIN) powder     [provider]  nystatin cream (MYCOSTATIN) Apply 1 application topically 3 (three) times daily. 06/06/19   [provider]  nystatin cream (MYCOSTATIN)     [provider]  Vitamin D, Ergocalciferol, (DRISDOL) 1.25 MG (50000 UNIT) CAPS capsule  07/26/19   [provider]    Allergies    Doxycycline  Review of Systems   Review of Systems  Constitutional: Positive for fatigue and fever. Negative for chills.  HENT: Negative for ear pain and sore throat.   Eyes: Negative for pain and visual disturbance.  Respiratory: Positive for cough and shortness of breath.   Cardiovascular: Negative for chest pain and palpitations.  Gastrointestinal: Negative for abdominal pain and vomiting.  Genitourinary: Negative for dysuria and hematuria.  Musculoskeletal: Negative for arthralgias and back pain.  Skin: Negative for color change and rash.  Neurological: Negative for seizures and syncope.  All other systems reviewed and are negative.   Physical Exam Updated Vital Signs BP 97/75   Pulse 95   Temp 100.3 F (37.9 C) (Oral)   Resp 11   SpO2 96%   Physical Exam Vitals and nursing note reviewed.  Constitutional:      General: She is not in acute distress.    Appearance: She is well-developed. She is ill-appearing.  HENT:     Head: Normocephalic and atraumatic.  Eyes:     Conjunctiva/sclera: Conjunctivae normal.     Pupils: Pupils are equal, round, and reactive to light.  Cardiovascular:     Rate and Rhythm: Regular rhythm. Tachycardia present.     Heart sounds: No murmur.  Pulmonary:     Effort: Tachypnea present. No respiratory distress.     Breath sounds: Decreased breath sounds present. No wheezing.  Abdominal:     Palpations: Abdomen is soft.     Tenderness: There is no abdominal tenderness.  Musculoskeletal:        General: Normal range of motion.     Cervical back: Neck supple.     Right lower leg: No edema.     Left lower leg: No edema.  Skin:     General: Skin is warm and dry.  Neurological:     Mental Status: She is alert.     ED Results / Procedures / Treatments   Labs (all labs ordered are listed, but only abnormal results are displayed) Labs Reviewed  CBC WITH DIFFERENTIAL/PLATELET - Abnormal; Notable for the following components:      Result Value   WBC 10.6 (*)    RBC 5.42 (*)    Hemoglobin 16.6 (*)    HCT 49.1 (*)    All other components within normal limits  COMPREHENSIVE METABOLIC PANEL - Abnormal; Notable for  the following components:   Potassium 3.2 (*)    CO2 21 (*)    Glucose, Bld 104 (*)    Calcium 8.6 (*)    Total Protein 8.6 (*)    All other components within normal limits  LACTATE DEHYDROGENASE - Abnormal; Notable for the following components:   LDH 254 (*)    All other components within normal limits  FERRITIN - Abnormal; Notable for the following components:   Ferritin 635 (*)    All other components within normal limits  FIBRINOGEN - Abnormal; Notable for the following components:   Fibrinogen 672 (*)    All other components within normal limits  C-REACTIVE PROTEIN - Abnormal; Notable for the following components:   CRP 13.0 (*)    All other components within normal limits  CULTURE, BLOOD (ROUTINE X 2)  CULTURE, BLOOD (ROUTINE X 2)  LACTIC ACID, PLASMA  D-DIMER, QUANTITATIVE (NOT AT [facility])  PROCALCITONIN  TRIGLYCERIDES  I-STAT BETA HCG BLOOD, ED ([facility], [facility], [facility] ONLY)    EKG EKG Interpretation  Date/Time:  Wednesday August 04 2019 19:03:27 EDT Ventricular Rate:  111 PR Interval:    QRS Duration: 82 QT Interval:  330 QTC Calculation: 449 R Axis:   44 Text Interpretation: Sinus tachycardia Low voltage, precordial leads Borderline T abnormalities, anterior leads Confirmed by Lennice Sites [phone]) on 08/04/2019 8:52:32 PM   Radiology DG Chest Portable 1 View  Result Date: 08/04/2019 CLINICAL DATA:  COVID EXAM: PORTABLE CHEST 1 VIEW COMPARISON:  12/13/2012 FINDINGS: Low lung volumes. Patchy  left lower lobe airspace opacity. Right lung clear. Heart is normal size. No effusions or acute bony abnormality. IMPRESSION: Low lung volumes. Patchy left lower lobe infiltrate/pneumonia. Electronically Signed   By: Rolm Baptise M.D.   On: 08/04/2019 19:08    Procedures .Critical Care Performed by: Lennice Sites, DO Authorized by: Lennice Sites, DO   Critical care provider statement:    Critical care time (minutes):  35   Critical care was necessary to treat or prevent imminent or life-threatening deterioration of the following conditions:  Respiratory failure   Critical care was time spent personally by me on the following activities:  Blood draw for specimens, development of treatment plan with patient or surrogate, discussions with primary provider, evaluation of patient's response to treatment, examination of patient, obtaining history from patient or surrogate, ordering and review of laboratory studies, ordering and performing treatments and interventions, ordering and review of radiographic studies, pulse oximetry, re-evaluation of patient's condition and review of old charts   I assumed direction of critical care for this patient from another provider in my specialty: no     (including critical care time)  Medications Ordered in ED Medications  acetaminophen (TYLENOL) tablet 650 mg (650 mg Oral Given 08/04/19 1845)  sodium chloride 0.9 % bolus 500 mL (0 mLs Intravenous Stopped 08/04/19 2055)  dexamethasone (DECADRON) injection 10 mg (10 mg Intravenous Given 08/04/19 2040)    ED Course  I have reviewed the triage vital signs and the nursing notes.  Pertinent labs & imaging results that were available during my care of the patient were reviewed by me and considered in my medical decision making (see chart for details).    MDM Rules/Calculators/A&P                      Clotilda SARAIYA KOZMA is a 34 year old female who presents to the ED with shortness of breath, cough.  Patient  diagnosed with coronavirus several days  ago.  Symptoms got worse this morning after feeling little bit better yesterday.  Patient arrives febrile and tachycardic.  Patient extremely tachypneic in the room with ambulation but does not become hypoxic.  When she ambulates heart rate goes to the 160s.  She denies any nausea, vomiting, diarrhea.  Patient is on Depo birth control.  Otherwise no significant medical problems.  Coarse and diminished breath sounds throughout on exam.  Anticipate likely admission given tachypnea, tachycardia.  Will give small fluid bolus and Tylenol to see if this may be dehydration related.  Anticipate given Decadron, antiviral but will discuss with hospitalist.  Chest x-ray shows infiltrate.  Leukocytosis 10.6.  Otherwise D-dimer unremarkable.  Inflammatory markers are elevated including LDH, ferritin, fibrinogen.  Patient given IV Decadron.  Patient with improved tachycardia given her tachypnea and tachycardia will admit for further care.  This chart was dictated using voice recognition software.  Despite best efforts to proofread,  errors can occur which can change the documentation meaning.    Final Clinical Impression(s) / ED Diagnoses Final diagnoses:  COVID-19    Rx / DC Orders ED Discharge Orders    None       Virgina Norfolk, DO 08/04/19 2108

## 2019-08-05 LAB — CBC WITH DIFFERENTIAL/PLATELET
Abs Immature Granulocytes: 0.05 10*3/uL (ref 0.00–0.07)
Basophils Absolute: 0 10*3/uL (ref 0.0–0.1)
Basophils Relative: 0 %
Eosinophils Absolute: 0 10*3/uL (ref 0.0–0.5)
Eosinophils Relative: 0 %
HCT: 44.6 % (ref 36.0–46.0)
Hemoglobin: 15.2 g/dL — ABNORMAL HIGH (ref 12.0–15.0)
Immature Granulocytes: 1 %
Lymphocytes Relative: 12 %
Lymphs Abs: 1 10*3/uL (ref 0.7–4.0)
MCH: 30.9 pg (ref 26.0–34.0)
MCHC: 34.1 g/dL (ref 30.0–36.0)
MCV: 90.7 fL (ref 80.0–100.0)
Monocytes Absolute: 0.1 10*3/uL (ref 0.1–1.0)
Monocytes Relative: 1 %
Neutro Abs: 7.3 10*3/uL (ref 1.7–7.7)
Neutrophils Relative %: 86 %
Platelets: 236 10*3/uL (ref 150–400)
RBC: 4.92 MIL/uL (ref 3.87–5.11)
RDW: 12.6 % (ref 11.5–15.5)
WBC: 8.4 10*3/uL (ref 4.0–10.5)
nRBC: 0 % (ref 0.0–0.2)

## 2019-08-05 LAB — FERRITIN: Ferritin: 588 ng/mL — ABNORMAL HIGH (ref 11–307)

## 2019-08-05 LAB — COMPREHENSIVE METABOLIC PANEL
ALT: 38 U/L (ref 0–44)
AST: 27 U/L (ref 15–41)
Albumin: 3.4 g/dL — ABNORMAL LOW (ref 3.5–5.0)
Alkaline Phosphatase: 62 U/L (ref 38–126)
Anion gap: 11 (ref 5–15)
BUN: 12 mg/dL (ref 6–20)
CO2: 21 mmol/L — ABNORMAL LOW (ref 22–32)
Calcium: 8.6 mg/dL — ABNORMAL LOW (ref 8.9–10.3)
Chloride: 106 mmol/L (ref 98–111)
Creatinine, Ser: 0.9 mg/dL (ref 0.44–1.00)
GFR calc Af Amer: >60 mL/min (ref >60)
GFR calc non Af Amer: >60 mL/min (ref >60)
Glucose, Bld: 166 mg/dL — ABNORMAL HIGH (ref 70–99)
Potassium: 3.6 mmol/L (ref 3.5–5.1)
Sodium: 138 mmol/L (ref 135–145)
Total Bilirubin: 0.6 mg/dL (ref 0.3–1.2)
Total Protein: 7.9 g/dL (ref 6.5–8.1)

## 2019-08-05 LAB — ABO/RH: ABO/RH(D): A POS

## 2019-08-05 LAB — D-DIMER, QUANTITATIVE: D-Dimer, Quant: 0.42 ug/mL-FEU (ref 0.00–0.50)

## 2019-08-05 LAB — C-REACTIVE PROTEIN: CRP: 13.3 mg/dL — ABNORMAL HIGH (ref <1.0)

## 2019-08-05 LAB — HIV ANTIBODY (ROUTINE TESTING W REFLEX): HIV Screen 4th Generation wRfx: NONREACTIVE

## 2019-08-05 MED ORDER — ASCORBIC ACID 500 MG PO TABS
500.0000 mg | ORAL_TABLET | Freq: Every day | ORAL | Status: DC
  Administered 2019-08-05 – 2019-08-06 (×2): 500 mg via ORAL
  Filled 2019-08-05 (×2): qty 1

## 2019-08-05 MED ORDER — ZINC SULFATE 220 (50 ZN) MG PO CAPS
220.0000 mg | ORAL_CAPSULE | Freq: Every day | ORAL | Status: DC
  Administered 2019-08-05 – 2019-08-06 (×2): 220 mg via ORAL
  Filled 2019-08-05 (×2): qty 1

## 2019-08-05 MED ORDER — SODIUM CHLORIDE 0.9 % IV SOLN: 100.0000 mg | Freq: Every day | INTRAVENOUS | Status: DC

## 2019-08-05 MED ORDER — ONDANSETRON HCL 4 MG/2ML IJ SOLN: 4.0000 mg | Freq: Four times a day (QID) | INTRAMUSCULAR | Status: DC | PRN

## 2019-08-05 MED ORDER — ACETAMINOPHEN 325 MG PO TABS: 650.0000 mg | ORAL_TABLET | Freq: Four times a day (QID) | ORAL | Status: DC | PRN

## 2019-08-05 MED ORDER — DEXAMETHASONE SODIUM PHOSPHATE 10 MG/ML IJ SOLN
6.0000 mg | Freq: Every day | INTRAMUSCULAR | Status: DC
  Administered 2019-08-05 (×2): 6 mg via INTRAVENOUS
  Filled 2019-08-05 (×2): qty 1

## 2019-08-05 MED ORDER — PHENOL 1.4 % MT LIQD
1.0000 | OROMUCOSAL | Status: DC | PRN
  Filled 2019-08-05: qty 177

## 2019-08-05 MED ORDER — DM-GUAIFENESIN ER 30-600 MG PO TB12
1.0000 | ORAL_TABLET | Freq: Two times a day (BID) | ORAL | Status: DC
  Administered 2019-08-05 – 2019-08-06 (×3): 1 via ORAL
  Filled 2019-08-05 (×3): qty 1

## 2019-08-05 MED ORDER — BENZONATATE 100 MG PO CAPS
200.0000 mg | ORAL_CAPSULE | Freq: Two times a day (BID) | ORAL | Status: DC | PRN
  Administered 2019-08-05 – 2019-08-06 (×2): 200 mg via ORAL
  Filled 2019-08-05: qty 2

## 2019-08-05 MED ORDER — IPRATROPIUM-ALBUTEROL 20-100 MCG/ACT IN AERS
1.0000 | INHALATION_SPRAY | Freq: Four times a day (QID) | RESPIRATORY_TRACT | Status: DC
  Administered 2019-08-05 – 2019-08-06 (×6): 1 via RESPIRATORY_TRACT
  Filled 2019-08-05: qty 4

## 2019-08-05 MED ORDER — GUAIFENESIN-DM 100-10 MG/5ML PO SYRP
10.0000 mL | ORAL_SOLUTION | ORAL | Status: DC | PRN
  Administered 2019-08-05: 10 mL via ORAL
  Filled 2019-08-05: qty 10

## 2019-08-05 MED ORDER — SODIUM CHLORIDE 0.9 % IV SOLN
100.0000 mg | Freq: Every day | INTRAVENOUS | Status: DC
  Administered 2019-08-06: 100 mg via INTRAVENOUS
  Filled 2019-08-05: qty 20

## 2019-08-05 MED ORDER — ONDANSETRON HCL 4 MG PO TABS: 4.0000 mg | ORAL_TABLET | Freq: Four times a day (QID) | ORAL | Status: DC | PRN

## 2019-08-05 MED ORDER — SODIUM CHLORIDE 0.9 % IV SOLN: INTRAVENOUS | Status: DC

## 2019-08-05 MED ORDER — SODIUM CHLORIDE 0.9 % IV SOLN: 200.0000 mg | Freq: Once | INTRAVENOUS | Status: DC

## 2019-08-05 MED ORDER — HYDROCOD POLST-CPM POLST ER 10-8 MG/5ML PO SUER
5.0000 mL | Freq: Two times a day (BID) | ORAL | Status: DC | PRN
  Administered 2019-08-05: 5 mL via ORAL
  Filled 2019-08-05 (×2): qty 5

## 2019-08-05 MED ORDER — ENOXAPARIN SODIUM 40 MG/0.4ML ~~LOC~~ SOLN
40.0000 mg | Freq: Every day | SUBCUTANEOUS | Status: DC
  Administered 2019-08-05 (×2): 40 mg via SUBCUTANEOUS
  Filled 2019-08-05 (×2): qty 0.4

## 2019-08-05 NOTE — Progress Notes (Signed)
Pt arrived to unit via ED stretcher. Pt does not seem to be in distress. Pt has been educated to unit and how to use call bell. Pt ha no complaints at this time. Pt vss  And is a/ox4 bed in lowest position and wheels locked. Will continue to monitor. Report called by Dorene Grebe.

## 2019-08-05 NOTE — Progress Notes (Signed)
PROGRESS NOTE    TARISHA FADER  QQV:[id number] DOB: 06-13-1985 DOA: 08/04/2019 PCP: Jordan Hawks, PA-C      Brief Narrative:  Mrs. Kristin Lang is a 34 y.o. F with morbid obesity who presented with several days progressive shortness of breath, fever and cough.  In the ER, COVID+, febrile, tachypneic and CXR with bilateral opacities.      Assessment & Plan:  COVID pneumonitis without acute hypoxic respiratory failure Tachypneic and with infiltrates on CXR but no hypoxia. This morning still feeling very out of breath with exertion. Procalcitonin undetectable, I feel it is safe to stop antibacterials, suspect this is all COVID pneumonia.  -Continue remdesivir, day 2 of 5 -Continue steroids, day 2    Hypokalemia Resolved       Disposition: Status is: Inpatient  Remains inpatient appropriate because:IV treatments appropriate due to intensity of illness or inability to take PO   Dispo: The patient is from: Home              Anticipated d/c is to: Home              Anticipated d/c date is: 1 day              Patient currently is not medically stable to d/c.               MDM: The below labs and imaging reports were reviewed and summarized above.  Medication management as above.    DVT prophylaxis: Lovenox Code Status: FULL         Subjective: Patient is still very out of breath with exertion.  She still coughing frequently and gets out of breath with coughing.  No fever, confusion, vomiting.  Objective: Vitals:   08/05/19 0011 08/05/19 0229 08/05/19 0636 08/05/19 1300  BP: 125/77  96/62 114/76  Pulse: (!) 105 88 81 96  Resp: (!) 24 (!) 22 18 18   Temp: 98.8 F (37.1 C)  98.3 F (36.8 C) 98.5 F (36.9 C)  TempSrc: Oral  Oral Oral  SpO2: 96%  97% 97%    Intake/Output Summary (Last 24 hours) at 08/05/2019 1753 Last data filed at 08/05/2019 1500 Gross per 24 hour  Intake 3007.96 ml  Output --  Net 3007.96 ml   There were no vitals  filed for this visit.  Examination: General appearance: Adult female, alert and in no acute distress.   HEENT: Anicteric, conjunctiva pink, lids and lashes normal. No nasal deformity, discharge, epistaxis.  Lips moist.   Skin: Warm and dry.   No suspicious rashes or lesions. Cardiac: RRR, nl S1-S2, no murmurs appreciated.  Capillary refill is brisk.  JVP not visible.  No LE edema.  Radial pulses 2+ and symmetric. Respiratory: Normal respiratory rate and rhythm.  CTAB without rales or wheezes.  Lung sounds diminished bilaterally.  Coughs occasionally. MSK: No deformities or effusions. Neuro: Awake and alert.  EOMI, moves all extremities. Speech fluent.    Psych: Sensorium intact and responding to questions, attention normal. Affect normal, pleasant.  Judgment and insight appear normal.    Data Reviewed: I have personally reviewed following labs and imaging studies:  CBC: Recent Labs  Lab 08/04/19 1826 08/05/19 0132  WBC 10.6* 8.4  NEUTROABS 7.5 7.3  HGB 16.6* 15.2*  HCT 49.1* 44.6  MCV 90.6 90.7  PLT 244 236   Basic Metabolic Panel: Recent Labs  Lab 08/04/19 1826 08/05/19 0132  NA 140 138  K 3.2* 3.6  CL 106 106  CO2 21* 21*  GLUCOSE 104* 166*  BUN 13 12  CREATININE 0.99 0.90  CALCIUM 8.6* 8.6*   GFR: CrCl cannot be calculated (Unknown ideal weight.). Liver Function Tests: Recent Labs  Lab 08/04/19 1826 08/05/19 0132  AST 32 27  ALT 42 38  ALKPHOS 67 62  BILITOT 0.7 0.6  PROT 8.6* 7.9  ALBUMIN 3.8 3.4*   No results for input(s): LIPASE, AMYLASE in the last 168 hours. No results for input(s): AMMONIA in the last 168 hours. Coagulation Profile: No results for input(s): INR, PROTIME in the last 168 hours. Cardiac Enzymes: No results for input(s): CKTOTAL, CKMB, CKMBINDEX, TROPONINI in the last 168 hours. BNP (last 3 results) No results for input(s): PROBNP in the last 8760 hours. HbA1C: No results for input(s): HGBA1C in the last 72 hours. CBG: No  results for input(s): GLUCAP in the last 168 hours. Lipid Profile: Recent Labs    08/04/19 1826  TRIG 63   Thyroid Function Tests: No results for input(s): TSH, T4TOTAL, FREET4, T3FREE, THYROIDAB in the last 72 hours. Anemia Panel: Recent Labs    08/04/19 1826 08/05/19 0132  FERRITIN 635* 588*   Urine analysis:    Component Value Date/Time   LABSPEC 1.020 07/03/2013 2004   PHURINE 7.5 07/03/2013 2004   GLUCOSEU NEGATIVE 07/03/2013 2004   HGBUR NEGATIVE 07/03/2013 2004   BILIRUBINUR NEGATIVE 07/03/2013 2004   KETONESUR NEGATIVE 07/03/2013 2004   PROTEINUR NEGATIVE 07/03/2013 2004   UROBILINOGEN 2.0 (H) 07/03/2013 2004   NITRITE NEGATIVE 07/03/2013 2004   LEUKOCYTESUR SMALL (A) 07/03/2013 2004   Sepsis Labs: @LABRCNTIP (procalcitonin:4,lacticacidven:4)  ) Recent Results (from the past 240 hour(s))  SARS CORONAVIRUS 2 (TAT 6-24 HRS) Nasopharyngeal Nasopharyngeal Swab     Status: Abnormal   Collection Time: 08/02/19  8:40 AM   Specimen: Nasopharyngeal Swab  Result Value Ref Range Status   SARS Coronavirus 2 POSITIVE (A) NEGATIVE Final    Comment: EMAILED MELISSA B. AT 0217 ON 08/03/2018 BY MESSAN H.  (NOTE) SARS-CoV-2 target nucleic acids are DETECTED. The SARS-CoV-2 RNA is generally detectable in upper and lower respiratory specimens during the acute phase of infection. Positive results are indicative of the presence of SARS-CoV-2 RNA. Clinical correlation with patient history and other diagnostic information is  necessary to determine patient infection status. Positive results do not rule out bacterial infection or co-infection with other viruses.  The expected result is Negative. Fact Sheet for Patients: 08/05/2018 Fact Sheet for Healthcare Providers: HairSlick.no This test is not yet approved or cleared by the quierodirigir.com FDA and  has been authorized for detection and/or diagnosis of SARS-CoV-2 by FDA  under an Emergency Use Authorization (EUA). This EUA will remain  in effect (meaning this test can be used) for the duration of the COVID-19 declar ation under Section 564(b)(1) of the Act, 21 U.S.C. section 360bbb-3(b)(1), unless the authorization is terminated or revoked sooner. Performed at [facility], [address], [place], [place]   Blood Culture (routine x 2)     Status: None (Preliminary result)   Collection Time: 08/04/19  6:46 PM   Specimen: BLOOD  Result Value Ref Range Status   Specimen Description   Final    BLOOD LEFT ANTECUBITAL Performed at [facility], [address], [place], [place]    Special Requests   Final    BOTTLES DRAWN AEROBIC AND ANAEROBIC Blood Culture adequate volume Performed at [facility], [address] [place],  [place] [address]    Culture   Final    NO GROWTH < 12 HOURS Performed at [facility], [place] [address] [place], [place] [address]    Report Status PENDING  Incomplete  Blood Culture (routine x 2)     Status: None (Preliminary result)   Collection Time: 08/04/19  6:46 PM   Specimen: BLOOD  Result Value Ref Range Status   Specimen Description   Final    BLOOD RIGHT ANTECUBITAL Performed at [place] [address], [place], [place] [address]    Special Requests   Final    BOTTLES DRAWN AEROBIC AND ANAEROBIC Blood Culture adequate volume Performed at [facility] [address], [place], [place] [address]    Culture   Final    NO GROWTH < 12 HOURS Performed at [facility] [address] [place], [place] [address]    Report Status PENDING  Incomplete         Radiology Studies: DG Chest Portable 1 View  Result Date: 08/04/2019 CLINICAL DATA:  COVID EXAM: PORTABLE CHEST 1 VIEW COMPARISON:  12/13/2012 FINDINGS: Low lung volumes. Patchy left lower lobe airspace opacity. Right lung clear. Heart is normal  size. No effusions or acute bony abnormality. IMPRESSION: Low lung volumes. Patchy left lower lobe infiltrate/pneumonia. Electronically Signed   By: Rolm Baptise M.D.   On: 08/04/2019 19:08        Scheduled Meds: . vitamin C  500 mg Oral Daily  . dexamethasone (DECADRON) injection  6 mg Intravenous QHS  . dextromethorphan-guaiFENesin  1 tablet Oral BID  . enoxaparin (LOVENOX) injection  40 mg Subcutaneous QHS  . Ipratropium-Albuterol  1 puff Inhalation Q6H  . zinc sulfate  220 mg Oral Daily   Continuous Infusions: . [START ON 08/06/2019] remdesivir 100 mg in NS 100 mL       LOS: 1 day    Time spent: 25 minutes    Edwin Dada, MD [facility] 08/05/2019, 5:53 PM     Please page though [place] or Epic secure chat:  For [employer], Adult nurse

## 2019-08-06 LAB — COMPREHENSIVE METABOLIC PANEL
ALT: 28 U/L (ref 0–44)
AST: 21 U/L (ref 15–41)
Albumin: 2.9 g/dL — ABNORMAL LOW (ref 3.5–5.0)
Alkaline Phosphatase: 53 U/L (ref 38–126)
Anion gap: 7 (ref 5–15)
BUN: 10 mg/dL (ref 6–20)
CO2: 21 mmol/L — ABNORMAL LOW (ref 22–32)
Calcium: 8.5 mg/dL — ABNORMAL LOW (ref 8.9–10.3)
Chloride: 114 mmol/L — ABNORMAL HIGH (ref 98–111)
Creatinine, Ser: 0.79 mg/dL (ref 0.44–1.00)
GFR calc Af Amer: >60 mL/min (ref >60)
GFR calc non Af Amer: >60 mL/min (ref >60)
Glucose, Bld: 147 mg/dL — ABNORMAL HIGH (ref 70–99)
Potassium: 3.7 mmol/L (ref 3.5–5.1)
Sodium: 142 mmol/L (ref 135–145)
Total Bilirubin: 0.4 mg/dL (ref 0.3–1.2)
Total Protein: 7.1 g/dL (ref 6.5–8.1)

## 2019-08-06 LAB — CBC WITH DIFFERENTIAL/PLATELET
Abs Immature Granulocytes: 0.06 10*3/uL (ref 0.00–0.07)
Basophils Absolute: 0 10*3/uL (ref 0.0–0.1)
Basophils Relative: 0 %
Eosinophils Absolute: 0 10*3/uL (ref 0.0–0.5)
Eosinophils Relative: 0 %
HCT: 40.9 % (ref 36.0–46.0)
Hemoglobin: 13.5 g/dL (ref 12.0–15.0)
Immature Granulocytes: 1 %
Lymphocytes Relative: 15 %
Lymphs Abs: 1.6 10*3/uL (ref 0.7–4.0)
MCH: 30.5 pg (ref 26.0–34.0)
MCHC: 33 g/dL (ref 30.0–36.0)
MCV: 92.3 fL (ref 80.0–100.0)
Monocytes Absolute: 0.6 10*3/uL (ref 0.1–1.0)
Monocytes Relative: 5 %
Neutro Abs: 8.8 10*3/uL — ABNORMAL HIGH (ref 1.7–7.7)
Neutrophils Relative %: 79 %
Platelets: 250 10*3/uL (ref 150–400)
RBC: 4.43 MIL/uL (ref 3.87–5.11)
RDW: 12.8 % (ref 11.5–15.5)
WBC: 11 10*3/uL — ABNORMAL HIGH (ref 4.0–10.5)
nRBC: 0 % (ref 0.0–0.2)

## 2019-08-06 LAB — FERRITIN: Ferritin: 679 ng/mL — ABNORMAL HIGH (ref 11–307)

## 2019-08-06 LAB — C-REACTIVE PROTEIN: CRP: 4.5 mg/dL — ABNORMAL HIGH (ref <1.0)

## 2019-08-06 LAB — D-DIMER, QUANTITATIVE: D-Dimer, Quant: <0.27 ug/mL-FEU (ref 0.00–0.50)

## 2019-08-06 MED ORDER — DEXAMETHASONE 2 MG PO TABS: ORAL_TABLET | ORAL | 0 refills | Status: DC

## 2019-08-06 MED ORDER — ASCORBIC ACID 500 MG PO TABS: 500.0000 mg | ORAL_TABLET | Freq: Every day | ORAL | Status: DC

## 2019-08-06 MED ORDER — HYDROCOD POLST-CPM POLST ER 10-8 MG/5ML PO SUER: 5.0000 mL | Freq: Two times a day (BID) | ORAL | 0 refills | Status: DC | PRN

## 2019-08-06 MED ORDER — ENOXAPARIN SODIUM 60 MG/0.6ML ~~LOC~~ SOLN: 0.5000 mg/kg | Freq: Every day | SUBCUTANEOUS | Status: DC

## 2019-08-06 NOTE — Discharge Instructions (Addendum)
You are scheduled for an outpatient infusion of Remdesivir at 10:00 AM on Saturday 4/24, Sunday 4/25 and Sunday 4/26  Please report to [facility] at [address].  Drive to the security guard and tell them you are here for an infusion. They will direct you to the front entrance where we will come and get you.  For questions call [phone].  Thanks

## 2019-08-06 NOTE — Progress Notes (Signed)
Discharge instructions and medication list reviewed. Educated on follow-up appointments. Voiced understanding. IV removed without complication. VSS. Discharging as ordered.

## 2019-08-06 NOTE — Progress Notes (Signed)
Patient scheduled for outpatient Remdesivir infusion at 10:00 AM on Saturday 4/24, Sunday 4/25 and Sunday 4/26.  Please advise them to report to [facility] at [address].  Drive to the security guard and tell them you are here for an infusion. They will direct you to the front entrance where we will come and get you.  For questions call [phone].  Thanks

## 2019-08-06 NOTE — Discharge Summary (Signed)
Physician Discharge Summary  Kristin Lang QPY:[id number] DOB: 22-Feb-1986 DOA: 08/04/2019  PCP: Jordan Hawks, PA-C  Admit date: 08/04/2019 Discharge date: 08/06/2019  Admitted From: Home  Disposition:  Home   Recommendations for Outpatient Follow-up:  1. Follow up with PCP in 2 weeks after COVID isolation ends 08/08/19    Home Health: None  Equipment/Devices: None  Discharge Condition: Good  CODE STATUS: FULL Diet recommendation: Regular  Brief/Interim Summary: Kristin Lang is a 34 y.o. F who presented with several days progressive shortness of breath, fever and cough.  In the ER, COVID+, febrile, tachypneic and CXR with bilateral opacities.     PRINCIPAL HOSPITAL DIAGNOSIS: COVID-19    Discharge Diagnoses:   COVID pneumonitis without acute hypoxic respiratory failure Patient was noted in the ER to be tachypneic, short of breath, coughing and with bilateral infiltrates on CXR.    Started on IV remdesivir and steroids, symptoms improved to baseline quickly.   Discharged to complete 5 days remdesivir at the outpatient infusion center.   Hypokalemia Resolved          Discharge Instructions  Discharge Instructions    Discharge instructions   Complete by: As directed    You were admitted for coronavirus (Also known as COVID-19)  You were treated with an anti-virus medicine ("remdesivir") and an anti-inflammatory (a "steroid") while you were here.  You should finish the remdesivir over the next 3 days at the infusion clinic.  See below for instructions.  Appointment are at [facility] at 10AM.  You should finish the course of steroids by taking dexamethasone once daily for 3 more days: Take dexamethasone 6 mg (3 tabs) on Saturday, then  Take dexamethasone 4 mg (2 tabs) on Sunday, then Take dexamethasone 2 mg (1 tab) on Monday, then stop  If you have any lingering cough, you should take the cough syrup we gave you here, Tussionex (this is  a controlled substance, avoid use while driving, while at work, and keep away from children.)  Another option while at work is Robitussin (with the ingredients "GUIAFENESIN" and "DEXTROMETHORPHAN")    HOW LONG TO REMAIN IN QUARANTINE: There is no absolutely correct answer to this and so our best answer is to be on the cautious side.  Based on what we know of the virus, you should isolate strictly until 10 days from your first symptoms or until April 25th  Until you end your quarantine: If you have anyone in the home who has NOT had coronavirus:    -avoid being in the same room with them until your self isolation is over    -if you MUST be in the same room, make sure you wear a mask and have them wear a mask and safety glasses (if available)    -clean all hard surfaces (counters, doors, tables) twice a day    -use a separate bathroom at all times   Increase activity slowly   Complete by: As directed      Allergies as of 08/06/2019      Reactions   Doxycycline Rash   Rash/bumps      Medication List    STOP taking these medications   cephALEXin 500 MG capsule Commonly known as: Keflex   clotrimazole-betamethasone cream Commonly known as: LOTRISONE   cyclobenzaprine 10 MG tablet Commonly known as: FLEXERIL   ibuprofen 800 MG tablet Commonly known as: ADVIL   metroNIDAZOLE 500 MG tablet Commonly known as: FLAGYL   nystatin powder Commonly known as:  MYCOSTATIN/NYSTOP     TAKE these medications   acetaminophen 500 MG tablet Commonly known as: TYLENOL Take 500-1,000 mg by mouth every 6 (six) hours as needed for fever or headache.   ascorbic acid 500 MG tablet Commonly known as: VITAMIN C Take 1 tablet (500 mg total) by mouth daily. Start taking on: August 07, 2019   benzonatate 100 MG capsule Commonly known as: TESSALON Take 1 capsule (100 mg total) by mouth every 8 (eight) hours.   chlorpheniramine-HYDROcodone 10-8 MG/5ML Suer Commonly known as: TUSSIONEX Take  5 mLs by mouth every 12 (twelve) hours as needed for cough.   dexamethasone 2 MG tablet Commonly known as: DECADRON Take 6 mg (3 tabs) once daily for 1 day then take 4 mg (2 tabs) once daily for 1 day then take 2 mg (1 tab) once daily for 1 day then stop.   fluticasone 50 MCG/ACT nasal spray Commonly known as: FLONASE Place 1 spray into both nostrils daily.   medroxyPROGESTERone 150 MG/ML injection Commonly known as: DEPO-PROVERA Inject 150 mg into the muscle every 3 (three) months.       Allergies  Allergen Reactions  . Doxycycline Rash    Rash/bumps    Consultations:     Procedures/Studies: DG Chest Portable 1 View  Result Date: 08/04/2019 CLINICAL DATA:  COVID EXAM: PORTABLE CHEST 1 VIEW COMPARISON:  12/13/2012 FINDINGS: Low lung volumes. Patchy left lower lobe airspace opacity. Right lung clear. Heart is normal size. No effusions or acute bony abnormality. IMPRESSION: Low lung volumes. Patchy left lower lobe infiltrate/pneumonia. Electronically Signed   By: Charlett NoseKevin  Dover M.D.   On: 08/04/2019 19:08      Subjective: Feeling well.  Still some cough, but less SOB.  No fever, vomiting, confusion, hemoptysis.  Discharge Exam: Vitals:   08/05/19 2143 08/06/19 0452  BP: 110/61 (!) 132/118  Pulse: 77 74  Resp: 16 18  Temp: 98.4 F (36.9 C) 98.1 F (36.7 C)  SpO2: 96% 99%   Vitals:   08/05/19 1900 08/05/19 2000 08/05/19 2143 08/06/19 0452  BP:   110/61 (!) 132/118  Pulse:   77 74  Resp: (!) 25 16 16 18   Temp:   98.4 F (36.9 C) 98.1 F (36.7 C)  TempSrc:   Oral Oral  SpO2:  96% 96% 99%    General: Pt is alert, awake, not in acute distress Cardiovascular: RRR, nl S1-S2, no murmurs appreciated.   No LE edema.   Respiratory: Normal respiratory rate and rhythm.  CTAB without rales or wheezes. Abdominal: Abdomen soft and non-tender.  No distension or HSM.   Neuro/Psych: Strength symmetric in upper and lower extremities.  Judgment and insight appear  normal.   The results of significant diagnostics from this hospitalization (including imaging, microbiology, ancillary and laboratory) are listed below for reference.     Microbiology: Recent Results (from the past 240 hour(s))  SARS CORONAVIRUS 2 (TAT 6-24 HRS) Nasopharyngeal Nasopharyngeal Swab     Status: Abnormal   Collection Time: 08/02/19  8:40 AM   Specimen: Nasopharyngeal Swab  Result Value Ref Range Status   SARS Coronavirus 2 POSITIVE (A) NEGATIVE Final    Comment: EMAILED MELISSA B. AT 0217 ON 08/03/2018 BY MESSAN H.  (NOTE) SARS-CoV-2 target nucleic acids are DETECTED. The SARS-CoV-2 RNA is generally detectable in upper and lower respiratory specimens during the acute phase of infection. Positive results are indicative of the presence of SARS-CoV-2 RNA. Clinical correlation with patient history and other diagnostic information is  necessary to determine patient infection status. Positive results do not rule out bacterial infection or co-infection with other viruses.  The expected result is Negative. Fact Sheet for Patients: HairSlick.no Fact Sheet for Healthcare Providers: quierodirigir.com This test is not yet approved or cleared by the [place] FDA and  has been authorized for detection and/or diagnosis of SARS-CoV-2 by FDA under an Emergency Use Authorization (EUA). This EUA will remain  in effect (meaning this test can be used) for the duration of the COVID-19 declar ation under Section 564(b)(1) of the Act, 21 U.S.C. section 360bbb-3(b)(1), unless the authorization is terminated or revoked sooner. Performed at [facility], [address], [place], [place] [address]   Blood Culture (routine x 2)     Status: None (Preliminary result)   Collection Time: 08/04/19  6:46 PM   Specimen: BLOOD  Result Value Ref Range Status   Specimen Description   Final    BLOOD LEFT ANTECUBITAL Performed at [facility], [address], [place], [place] [address]    Special Requests   Final    BOTTLES DRAWN AEROBIC AND ANAEROBIC Blood Culture adequate volume Performed at [facility], [address] [place], [place] [address]    Culture   Final    NO GROWTH 2 DAYS Performed at [facility], [address], [place], [place] [address]    Report Status PENDING  Incomplete  Blood Culture (routine x 2)     Status: None (Preliminary result)   Collection Time: 08/04/19  6:46 PM   Specimen: BLOOD  Result Value Ref Range Status   Specimen Description   Final    BLOOD RIGHT ANTECUBITAL Performed at [facility], [address], [place], [place] [address]    Special Requests   Final    BOTTLES DRAWN AEROBIC AND ANAEROBIC Blood Culture adequate volume Performed at [facility], [address] [place], [place] [address]    Culture   Final    NO GROWTH 2 DAYS Performed at [facility], [address] [place], [place] [address]    Report Status PENDING  Incomplete     Labs: BNP (last 3 results) No results for input(s): BNP in the last 8760 hours. Basic Metabolic Panel: Recent Labs  Lab 08/04/19 1826 08/05/19 0132 08/06/19 0201  NA 140 138 142  K 3.2* 3.6 3.7  CL 106 106 114*  CO2 21* 21* 21*  GLUCOSE 104* 166* 147*  BUN 13 12 10   CREATININE 0.99 0.90 0.79  CALCIUM 8.6* 8.6* 8.5*   Liver Function Tests: Recent Labs  Lab 08/04/19 1826 08/05/19 0132 08/06/19 0201  AST 32 27 21  ALT 42 38 28  ALKPHOS 67 62 53  BILITOT 0.7 0.6 0.4  PROT 8.6* 7.9 7.1  ALBUMIN 3.8 3.4* 2.9*   No results for input(s): LIPASE, AMYLASE in the last 168 hours. No results for input(s): AMMONIA in the last 168 hours. CBC: Recent Labs  Lab 08/04/19 1826 08/05/19 0132 08/06/19 0201  WBC 10.6* 8.4 11.0*  NEUTROABS 7.5 7.3 8.8*  HGB 16.6* 15.2* 13.5  HCT 49.1* 44.6 40.9  MCV 90.6 90.7 92.3  PLT 244 236 250    Cardiac Enzymes: No results for input(s): CKTOTAL, CKMB, CKMBINDEX, TROPONINI in the last 168 hours. BNP: Invalid input(s): POCBNP CBG: No results for input(s): GLUCAP in the last 168 hours. D-Dimer Recent Labs    08/05/19 0132 08/06/19 0201  DDIMER  0.42 <0.27   Hgb A1c No results for input(s): HGBA1C in the last 72 hours. Lipid Profile Recent Labs    08/04/19 1826  TRIG 63   Thyroid function studies No results for input(s): TSH, T4TOTAL, T3FREE, THYROIDAB in the last 72 hours.  Invalid input(s): FREET3 Anemia work up Recent Labs    08/05/19 0132 08/06/19 0201  FERRITIN 588* 679*   Urinalysis    Component Value Date/Time   LABSPEC 1.020 07/03/2013 2004   PHURINE 7.5 07/03/2013 2004   GLUCOSEU NEGATIVE 07/03/2013 2004   HGBUR NEGATIVE 07/03/2013 2004   BILIRUBINUR NEGATIVE 07/03/2013 2004   KETONESUR NEGATIVE 07/03/2013 2004   PROTEINUR NEGATIVE 07/03/2013 2004   UROBILINOGEN 2.0 (H) 07/03/2013 2004   NITRITE NEGATIVE 07/03/2013 2004   LEUKOCYTESUR SMALL (A) 07/03/2013 2004   Sepsis Labs Invalid input(s): PROCALCITONIN,  WBC,  LACTICIDVEN Microbiology Recent Results (from the past 240 hour(s))  SARS CORONAVIRUS 2 (TAT 6-24 HRS) Nasopharyngeal Nasopharyngeal Swab     Status: Abnormal   Collection Time: 08/02/19  8:40 AM   Specimen: Nasopharyngeal Swab  Result Value Ref Range Status   SARS Coronavirus 2 POSITIVE (A) NEGATIVE Final    Comment: EMAILED MELISSA B. AT 0217 ON 08/03/2018 BY MESSAN H.  (NOTE) SARS-CoV-2 target nucleic acids are DETECTED. The SARS-CoV-2 RNA is generally detectable in upper and lower respiratory specimens during the acute phase of infection. Positive results are indicative of the presence of SARS-CoV-2 RNA. Clinical correlation with patient history and other diagnostic information is  necessary to determine patient infection status. Positive results do not rule out bacterial infection or co-infection with other viruses.  The  expected result is Negative. Fact Sheet for Patients: SugarRoll.be Fact Sheet for Healthcare Providers: https://www.woods-mathews.com/ This test is not yet approved or cleared by the [place] FDA and  has been authorized for detection and/or diagnosis of SARS-CoV-2 by FDA under an Emergency Use Authorization (EUA). This EUA will remain  in effect (meaning this test can be used) for the duration of the COVID-19 declar ation under Section 564(b)(1) of the Act, 21 U.S.C. section 360bbb-3(b)(1), unless the authorization is terminated or revoked sooner. Performed at [facility], [place] [address] [place], [place] [address]   Blood Culture (routine x 2)     Status: None (Preliminary result)   Collection Time: 08/04/19  6:46 PM   Specimen: BLOOD  Result Value Ref Range Status   Specimen Description   Final    BLOOD LEFT ANTECUBITAL Performed at [facility] [address] [place], [place] [address]    Special Requests   Final    BOTTLES DRAWN AEROBIC AND ANAEROBIC Blood Culture adequate volume Performed at [facility] [address] [place], [place] [address]    Culture   Final    NO GROWTH 2 DAYS Performed at [facility] [address], [place], [place] [address]    Report Status PENDING  Incomplete  Blood Culture (routine x 2)     Status: None (Preliminary result)   Collection Time: 08/04/19  6:46 PM   Specimen: BLOOD  Result Value Ref Range Status   Specimen Description   Final    BLOOD RIGHT ANTECUBITAL Performed at [facility] [address] [place], [place] [address]    Special Requests   Final    BOTTLES DRAWN AEROBIC AND ANAEROBIC Blood Culture adequate volume Performed at [facility] [address] [place], [place] [address]  Culture   Final    NO GROWTH 2 DAYS Performed at [facility], [address]  [place], [place] [address]    Report Status PENDING  Incomplete     Time coordinating discharge: 25 minutes The [place] controlled substances registry was reviewed for this patient prior to filling the <5 days supply controlled substances script.      SIGNED:   Alberteen Sam, MD  [facility] 08/06/2019, 4:55 PM

## 2019-08-07 ENCOUNTER — Ambulatory Visit (HOSPITAL_COMMUNITY)
Admission: RE | Admit: 2019-08-07 | Discharge: 2019-08-07 | Disposition: A | Discharge status: Home / Self Care | Payer: 59 | Source: Ambulatory Visit | Attending: Pulmonary Disease | Admitting: Pulmonary Disease

## 2019-08-07 VITALS — BP 104/73 | HR 70 | Temp 98.3°F | Resp 17

## 2019-08-07 DIAGNOSIS — J1282 Pneumonia due to coronavirus disease 2019: Secondary | ICD-10-CM | POA: Insufficient documentation

## 2019-08-07 DIAGNOSIS — U071 COVID-19: Secondary | ICD-10-CM | POA: Insufficient documentation

## 2019-08-07 MED ORDER — SODIUM CHLORIDE 0.9 % IV SOLN
100.0000 mg | Freq: Once | INTRAVENOUS | Status: DC
  Administered 2019-08-07: 100 mg via INTRAVENOUS
  Filled 2019-08-07: qty 20

## 2019-08-07 MED ORDER — ALBUTEROL SULFATE HFA 108 (90 BASE) MCG/ACT IN AERS: 2.0000 | INHALATION_SPRAY | Freq: Once | RESPIRATORY_TRACT | Status: DC | PRN

## 2019-08-07 MED ORDER — METHYLPREDNISOLONE SODIUM SUCC 125 MG IJ SOLR: 125.0000 mg | Freq: Once | INTRAMUSCULAR | Status: DC | PRN

## 2019-08-07 MED ORDER — DIPHENHYDRAMINE HCL 50 MG/ML IJ SOLN: 50.0000 mg | Freq: Once | INTRAMUSCULAR | Status: DC | PRN

## 2019-08-07 MED ORDER — SODIUM CHLORIDE 0.9 % IV SOLN: INTRAVENOUS | Status: DC | PRN

## 2019-08-07 MED ORDER — EPINEPHRINE 0.3 MG/0.3ML IJ SOAJ: 0.3000 mg | Freq: Once | INTRAMUSCULAR | Status: DC | PRN

## 2019-08-07 MED ORDER — FAMOTIDINE IN NACL 20-0.9 MG/50ML-% IV SOLN: 20.0000 mg | Freq: Once | INTRAVENOUS | Status: DC | PRN

## 2019-08-07 NOTE — Progress Notes (Signed)
  Diagnosis: COVID-19  Physician: Dr. Delford Field  Procedure: Covid Infusion Clinic Med: remdesivir infusion - Provided patient with remdesivir fact sheet for patients, parents and caregivers prior to infusion.  Complications: No immediate complications noted.  Discharge: Discharged home   Essie Hart 08/07/2019

## 2019-08-07 NOTE — Discharge Instructions (Signed)
10 Things You Can Do to Manage Your COVID-19 Symptoms at Home If you have possible or confirmed COVID-19: 1. Stay home from work and school. And stay away from other public places. If you must go out, avoid using any kind of public transportation, ridesharing, or taxis. 2. Monitor your symptoms carefully. If your symptoms get worse, call your healthcare provider immediately. 3. Get rest and stay hydrated. 4. If you have a medical appointment, call the healthcare provider ahead of time and tell them that you have or may have COVID-19. 5. For medical emergencies, call 911 and notify the dispatch personnel that you have or may have COVID-19. 6. Cover your cough and sneezes with a tissue or use the inside of your elbow. 7. Wash your hands often with soap and water for at least 20 seconds or clean your hands with an alcohol-based hand sanitizer that contains at least 60% alcohol. 8. As much as possible, stay in a specific room and away from other people in your home. Also, you should use a separate bathroom, if available. If you need to be around other people in or outside of the home, wear a mask. 9. Avoid sharing personal items with other people in your household, like dishes, towels, and bedding. 10. Clean all surfaces that are touched often, like counters, tabletops, and doorknobs. Use household cleaning sprays or wipes according to the label instructions. cdc.gov/coronavirus 10/14/2018 This information is not intended to replace advice given to you by your health care provider. Make sure you discuss any questions you have with your health care provider. Document Revised: 03/18/2019 Document Reviewed: 03/18/2019 Elsevier Patient Education  2020 Elsevier Inc.  

## 2019-08-08 ENCOUNTER — Ambulatory Visit (HOSPITAL_COMMUNITY)
Admit: 2019-08-08 | Discharge: 2019-08-08 | Disposition: A | Discharge status: Home / Self Care | Payer: 59 | Attending: Pulmonary Disease | Admitting: Pulmonary Disease

## 2019-08-08 DIAGNOSIS — U071 COVID-19: Secondary | ICD-10-CM | POA: Diagnosis not present

## 2019-08-08 DIAGNOSIS — J1282 Pneumonia due to coronavirus disease 2019: Secondary | ICD-10-CM | POA: Diagnosis not present

## 2019-08-08 MED ORDER — SODIUM CHLORIDE 0.9 % IV SOLN: INTRAVENOUS | Status: DC | PRN

## 2019-08-08 MED ORDER — SODIUM CHLORIDE 0.9 % IV SOLN
100.0000 mg | Freq: Once | INTRAVENOUS | Status: AC
  Administered 2019-08-08: 10:00:00 100 mg via INTRAVENOUS
  Filled 2019-08-08: qty 20

## 2019-08-08 MED ORDER — DIPHENHYDRAMINE HCL 50 MG/ML IJ SOLN: 50.0000 mg | Freq: Once | INTRAMUSCULAR | Status: DC | PRN

## 2019-08-08 MED ORDER — METHYLPREDNISOLONE SODIUM SUCC 125 MG IJ SOLR: 125.0000 mg | Freq: Once | INTRAMUSCULAR | Status: DC | PRN

## 2019-08-08 MED ORDER — ALBUTEROL SULFATE HFA 108 (90 BASE) MCG/ACT IN AERS: 2.0000 | INHALATION_SPRAY | Freq: Once | RESPIRATORY_TRACT | Status: DC | PRN

## 2019-08-08 MED ORDER — FAMOTIDINE IN NACL 20-0.9 MG/50ML-% IV SOLN: 20.0000 mg | Freq: Once | INTRAVENOUS | Status: DC | PRN

## 2019-08-08 MED ORDER — EPINEPHRINE 0.3 MG/0.3ML IJ SOAJ: 0.3000 mg | Freq: Once | INTRAMUSCULAR | Status: DC | PRN

## 2019-08-08 NOTE — Progress Notes (Signed)
  Diagnosis: COVID-19  Physician: Dr. Delford Field  Procedure: [facility] Med: remdesivir infusion - Provided patient with remdesivir fact sheet for patients, parents and caregivers prior to infusion.  Complications: No immediate complications noted.  Discharge: Discharged home   Kristin Lang 08/08/2019

## 2019-08-09 ENCOUNTER — Ambulatory Visit (HOSPITAL_COMMUNITY)
Admit: 2019-08-09 | Discharge: 2019-08-09 | Disposition: A | Discharge status: Home / Self Care | Payer: 59 | Attending: Pulmonary Disease | Admitting: Pulmonary Disease

## 2019-08-09 ENCOUNTER — Encounter: Payer: Self-pay | Admitting: *Deleted

## 2019-08-09 ENCOUNTER — Other Ambulatory Visit: Payer: Self-pay | Admitting: *Deleted

## 2019-08-09 DIAGNOSIS — J1282 Pneumonia due to coronavirus disease 2019: Secondary | ICD-10-CM | POA: Diagnosis not present

## 2019-08-09 DIAGNOSIS — U071 COVID-19: Secondary | ICD-10-CM | POA: Diagnosis not present

## 2019-08-09 LAB — CULTURE, BLOOD (ROUTINE X 2)
Culture: NO GROWTH
Culture: NO GROWTH
Special Requests: ADEQUATE
Special Requests: ADEQUATE

## 2019-08-09 MED ORDER — SODIUM CHLORIDE 0.9 % IV SOLN: INTRAVENOUS | Status: DC | PRN

## 2019-08-09 MED ORDER — SODIUM CHLORIDE 0.9 % IV SOLN
100.0000 mg | Freq: Once | INTRAVENOUS | Status: AC
  Administered 2019-08-09: 100 mg via INTRAVENOUS
  Filled 2019-08-09: qty 20

## 2019-08-09 MED ORDER — EPINEPHRINE 0.3 MG/0.3ML IJ SOAJ: 0.3000 mg | Freq: Once | INTRAMUSCULAR | Status: DC | PRN

## 2019-08-09 MED ORDER — FAMOTIDINE IN NACL 20-0.9 MG/50ML-% IV SOLN: 20.0000 mg | Freq: Once | INTRAVENOUS | Status: DC | PRN

## 2019-08-09 MED ORDER — METHYLPREDNISOLONE SODIUM SUCC 125 MG IJ SOLR: 125.0000 mg | Freq: Once | INTRAMUSCULAR | Status: DC | PRN

## 2019-08-09 MED ORDER — DIPHENHYDRAMINE HCL 50 MG/ML IJ SOLN: 50.0000 mg | Freq: Once | INTRAMUSCULAR | Status: DC | PRN

## 2019-08-09 MED ORDER — ALBUTEROL SULFATE HFA 108 (90 BASE) MCG/ACT IN AERS: 2.0000 | INHALATION_SPRAY | Freq: Once | RESPIRATORY_TRACT | Status: DC | PRN

## 2019-08-09 NOTE — Patient Outreach (Signed)
[facility]) Care Management  08/09/2019  KAMDEN REBER 12-27-1985 [id number]   Transition of care call   Referral received:08/05/19 Initial outreach:08/09/19 Insurance: [place] [employer]    Subjective: Initial successful telephone call to patient's preferred number in order to complete transition of care assessment; 2 HIPAA identifiers verified. Explained purpose of call and completed transition of care assessment.  Lucely states that she is doing a lot better. She reports having a lingers cough and shortness of breath that has not increased ,she reports that her oxygen level as always been good when asked about oxygen saturation at home. She reports relief of cough with use of prn tussionex. She denies having temperature elevation. She report tolerating diet with appetite improving states she did not lose her taste or smell. She reports completing final infusion of Remdesivir on today, she has been driving herself to treatments. She reports having friend support to come and leave supplies. She voiced understanding of covid 19, isolation precautions and isolation end date of 4/25. .  She denies any ongoing health issues and says she does not need a referral to one of the [place] chronic disease management programs.  She is unsure if she has  the hospital indemnity plan but will go online to look at her benefits, denies need for contact number information.  She does not use a [facility]  She denies educational needs related to staying safe during the COVID 19 pandemic.    Objective:   Zyriah Mask  was hospitalized at [facility]  from 4/21-4/23/21 for Pneumonia due to Covid 19 virus  Comorbidities include: Obesity She was discharged to home on 08/06/19 without the need for home health services or DME.   Assessment:  Patient voices good understanding of all discharge instructions.  See transition of care flowsheet for assessment details.   Plan:   Reviewed hospital discharge diagnosis of Pneumonia due to Covid 19 virus   and discharge treatment plan using hospital discharge instructions, assessing medication adherence, reviewing problems requiring provider notification, and discussing the importance of follow up with  primary care provider  as directed. Reinforced follow up with PCP in 2 weeks after Covid isolation ends on 4/25.  Reviewed [place] healthy lifestyle program information to receive discounted premium for  2022   Step 1: Get  your annual physical  Step 2: Complete your health assessment between April 16, 2019 and December 15, 2019 at [email or web address]  Step 3:Identify your current health status and complete the corresponding action step between January 1, and December 15, 2019.    Patient agreeable to follow up call in the next 4 days to follow up scheduling post discharge visit with provider. Will  route successful outreach letter with [facility] Care Management pamphlet and 24 Hour Nurse Line Magnet to [employer] Care Management clinical pool to be mailed to patient's home address.  Egbert Garibaldi, RN, BSN  [facility] Care Management,Care Management Coordinator  [phone]- Mobile [phone]- Toll Free Main Office

## 2019-08-09 NOTE — Discharge Instructions (Signed)

## 2019-08-09 NOTE — Progress Notes (Signed)
  Diagnosis: COVID-19  Physician: Dr. Delford Field  Procedure: [facility] Infusion Clinic Med: remdesivir infusion - Provided patient with remdesivir fact sheet for patients, parents and caregivers prior to infusion.  Complications: No immediate complications noted.  Discharge: Discharged home   Kristin Lang 08/09/2019

## 2019-08-12 ENCOUNTER — Other Ambulatory Visit: Payer: Self-pay | Admitting: *Deleted

## 2019-08-12 NOTE — Patient Outreach (Signed)
[facility]) Care Management  08/12/2019  Kristin Lang 09-05-1985 [id number]   Transition of care follow up call/case closure    Referral received:08/05/19 Initial outreach:08/09/19 Insurance: [place] [employer]    Subjective : Successful outreach call to patient , she reports feeling fine, reports having lingering cough but manageable.  She denies shortness of breath or fever. She reports appetite and intake improved.  She discussed having scheduled post discharge visit with PCP in the next week. She denies any other concerns at this time.    Objective: Kristin Lang was hospitalized [facility]  from 4/21-4/23/21 for Pneumonia due to Covid 19 virus  Comorbidities include: Obesity She was discharged to home on 08/06/19 without the need for home health servicesor DME.   Plan No ongoing care management needs identified, will plan case closure.  Egbert Garibaldi, RN, BSN  [facility] Care Management,Care Management Coordinator  [phone]- Mobile [phone]- Toll Free Main Office

## 2019-08-17 DIAGNOSIS — J1282 Pneumonia due to coronavirus disease 2019: Secondary | ICD-10-CM | POA: Diagnosis not present

## 2019-08-17 DIAGNOSIS — U071 COVID-19: Secondary | ICD-10-CM | POA: Diagnosis not present

## 2019-08-31 DIAGNOSIS — Z118 Encounter for screening for other infectious and parasitic diseases: Secondary | ICD-10-CM | POA: Diagnosis not present

## 2019-08-31 DIAGNOSIS — N760 Acute vaginitis: Secondary | ICD-10-CM | POA: Diagnosis not present

## 2019-08-31 DIAGNOSIS — Z1151 Encounter for screening for human papillomavirus (HPV): Secondary | ICD-10-CM | POA: Diagnosis not present

## 2019-08-31 DIAGNOSIS — Z01419 Encounter for gynecological examination (general) (routine) without abnormal findings: Secondary | ICD-10-CM | POA: Diagnosis not present

## 2019-08-31 DIAGNOSIS — Z6838 Body mass index (BMI) 38.0-38.9, adult: Secondary | ICD-10-CM | POA: Diagnosis not present

## 2019-08-31 DIAGNOSIS — Z8742 Personal history of other diseases of the female genital tract: Secondary | ICD-10-CM | POA: Diagnosis not present

## 2019-09-22 DIAGNOSIS — E559 Vitamin D deficiency, unspecified: Secondary | ICD-10-CM | POA: Diagnosis not present

## 2019-10-05 DIAGNOSIS — N920 Excessive and frequent menstruation with regular cycle: Secondary | ICD-10-CM | POA: Diagnosis not present

## 2019-10-19 ENCOUNTER — Encounter (HOSPITAL_COMMUNITY): Payer: Self-pay

## 2019-10-19 ENCOUNTER — Other Ambulatory Visit: Payer: Self-pay

## 2019-10-19 ENCOUNTER — Ambulatory Visit (HOSPITAL_COMMUNITY)
Admission: RE | Admit: 2019-10-19 | Discharge: 2019-10-19 | Disposition: A | Discharge status: Home / Self Care | Payer: 59 | Source: Ambulatory Visit | Attending: Family Medicine | Admitting: Family Medicine

## 2019-10-19 VITALS — BP 113/80 | HR 94 | Temp 98.4°F | Resp 16

## 2019-10-19 DIAGNOSIS — N760 Acute vaginitis: Secondary | ICD-10-CM | POA: Insufficient documentation

## 2019-10-19 MED ORDER — FLUCONAZOLE 150 MG PO TABS
150.0000 mg | ORAL_TABLET | Freq: Every day | ORAL | 0 refills | Status: DC
Start: 2019-10-19 — End: 2020-10-24

## 2019-10-19 NOTE — ED Triage Notes (Signed)
PT has taken amoxicillin off and on for months for BV and trich.   History of herpes.   Vaginal itching, no dysuria, rash on inner thighs (believes this is from chafing).

## 2019-10-19 NOTE — ED Provider Notes (Signed)
[facility]    CSN: [id number] Arrival date & time: 10/19/19  1246      History   Chief Complaint Chief Complaint  Patient presents with  . Appointment  . Vaginal Itching    HPI Kristin Lang is a 34 y.o. female.   HPI   Patient states she has been having vaginal infections off and on She was seen at the [facility] urgent care center in February given metronidazole for trichomonas and BV She states that she has been taking amoxicillin on her own for the recurring vaginal infection Now she has a rash on her upper thighs and some vaginal irritation.  Scant discharge.  No odor She does not feel like she has any likely sexually transmitted disease.  She would like STD testing. She is certain that she is not pregnant No dysuria or urinary symptoms  Past Medical History:  Diagnosis Date  . Abnormal Pap smear   . Chlamydia   . Gonorrhea   . History of elective abortion     Patient Active Problem List   Diagnosis Date Noted  . Hypokalemia 08/04/2019  . Pneumonia due to COVID-19 virus 08/04/2019  . 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 CESAREAN SECTION      OB History    Gravida  2   Para  1   Term  1   Preterm      AB  1   Living  1     SAB      TAB  1   Ectopic      Multiple      Live Births               Home Medications    Prior to Admission medications   Medication Sig Start Date End Date Taking? Authorizing Provider  acetaminophen (TYLENOL) 500 MG tablet Take 500-1,000 mg by mouth every 6 (six) hours as needed for fever or headache.    [provider]  ascorbic acid (VITAMIN C) 500 MG tablet Take 1 tablet (500 mg total) by mouth daily. 08/07/19   Danford, Earl Lites, MD  benzonatate (TESSALON) 100 MG capsule Take 1 capsule (100 mg total) by mouth every 8 (eight) hours. 08/02/19   Darr, Veryl Speak, PA-C  chlorpheniramine-HYDROcodone (TUSSIONEX) 10-8 MG/5ML  SUER Take 5 mLs by mouth every 12 (twelve) hours as needed for cough. 08/06/19   Danford, Earl Lites, MD  dexamethasone (DECADRON) 2 MG tablet Take 6 mg (3 tabs) once daily for 1 day then take 4 mg (2 tabs) once daily for 1 day then take 2 mg (1 tab) once daily for 1 day then stop. 08/06/19   Danford, Earl Lites, MD  fluconazole (DIFLUCAN) 150 MG tablet Take 1 tablet (150 mg total) by mouth daily. Repeat in 1 week if needed 10/19/19   Eustace Moore, MD  fluticasone [facility]) 50 MCG/ACT nasal spray Place 1 spray into both nostrils daily. 08/02/19 09/01/19  Darr, Veryl Speak, PA-C  medroxyPROGESTERone (DEPO-PROVERA) 150 MG/ML injection Inject 150 mg into the muscle every 3 (three) months.    [provider]    Family History Family History  Problem Relation Age of Onset  . Thyroid nodules Mother   . Prostate cancer Father   . Hypertension Maternal Grandmother   . Diabetes Maternal Grandmother   . Hypertension Maternal Grandfather   . Diabetes Maternal Grandfather     Social  History Social History   Tobacco Use  . Smoking status: Never Smoker  . Smokeless tobacco: Never Used  Vaping Use  . Vaping Use: Never used  Substance Use Topics  . Alcohol use: Yes    Comment: daily for last couple weeks  . Drug use: No     Allergies   Doxycycline   Review of Systems Review of Systems See HPI  Physical Exam Triage Vital Signs ED Triage Vitals  Enc Vitals Group     BP 10/19/19 1301 113/80     Pulse Rate 10/19/19 1301 94     Resp 10/19/19 1301 16     Temp 10/19/19 1301 98.4 F (36.9 C)     Temp Source 10/19/19 1301 Oral     SpO2 10/19/19 1301 98 %     Weight --      Height --      Head Circumference --      Peak Flow --      Pain Score 10/19/19 1258 0     Pain Loc --      Pain Edu? --      Excl. in GC? --    No data found.  Updated Vital Signs BP 113/80   Pulse 94   Temp 98.4 F (36.9 C) (Oral)   Resp 16   SpO2 98%      Physical Exam Constitutional:        General: She is not in acute distress.    Appearance: She is well-developed.     Comments: No overweight  HENT:     Head: Normocephalic and atraumatic.  Eyes:     Conjunctiva/sclera: Conjunctivae normal.     Pupils: Pupils are equal, round, and reactive to light.  Cardiovascular:     Rate and Rhythm: Normal rate.  Pulmonary:     Effort: Pulmonary effort is normal. No respiratory distress.  Abdominal:     General: Abdomen is flat. There is no distension.     Palpations: Abdomen is soft.     Tenderness: There is no abdominal tenderness.  Genitourinary:    Comments: Copious white discharge.  Mild erythema to labia and upper thighs/groin region Musculoskeletal:        General: Normal range of motion.     Cervical back: Normal range of motion.  Skin:    General: Skin is warm and dry.  Neurological:     Mental Status: She is alert.  Psychiatric:        Mood and Affect: Mood normal.        Behavior: Behavior normal.      UC Treatments / Results  Labs (all labs ordered are listed, but only abnormal results are displayed) Labs Reviewed  CERVICOVAGINAL ANCILLARY ONLY    EKG   Radiology No results found.  Procedures Procedures (including critical care time)  Medications Ordered in UC Medications - No data to display  Initial Impression / Assessment and Plan / UC Course  I have reviewed the triage vital signs and the nursing notes.  Pertinent labs & imaging results that were available during my care of the patient were reviewed by me and considered in my medical decision making (see chart for details).     Vaginal swab obtained.  Will send to laboratory.  We will treat for yeast infection since clinically this is what is suspected based on history physical examination  Is advised to stop amoxicillin Final Clinical Impressions(s) / UC Diagnoses   Final diagnoses:  Acute  vaginitis     Discharge Instructions     Diflucan today Repeat in 1 week if your  symptoms are not better Check for your test results on MyChart You will be called if any results are positive, or you need additional medication/treatment   ED Prescriptions    Medication Sig Dispense Auth. Provider   fluconazole (DIFLUCAN) 150 MG tablet Take 1 tablet (150 mg total) by mouth daily. Repeat in 1 week if needed 2 tablet Eustace Moore, MD     PDMP not reviewed this encounter.   Eustace Moore, MD 10/19/19 1340

## 2019-10-19 NOTE — Discharge Instructions (Addendum)
Diflucan today Repeat in 1 week if your symptoms are not better Check for your test results on MyChart You will be called if any results are positive, or you need additional medication/treatment

## 2019-10-20 LAB — CERVICOVAGINAL ANCILLARY ONLY
Bacterial Vaginitis (gardnerella): NEGATIVE
Candida Glabrata: POSITIVE — AB
Candida Vaginitis: POSITIVE — AB
Chlamydia: NEGATIVE
Comment: NEGATIVE
Comment: NEGATIVE
Comment: NEGATIVE
Comment: NEGATIVE
Comment: NEGATIVE
Comment: NORMAL
Neisseria Gonorrhea: NEGATIVE
Trichomonas: NEGATIVE

## 2019-12-13 DIAGNOSIS — E559 Vitamin D deficiency, unspecified: Secondary | ICD-10-CM | POA: Diagnosis not present

## 2020-01-06 ENCOUNTER — Telehealth: Payer: Self-pay | Admitting: Family

## 2020-01-06 MED ORDER — CEPHALEXIN 500 MG PO CAPS: 500.0000 mg | ORAL_CAPSULE | Freq: Two times a day (BID) | ORAL | 1 refills | Status: DC

## 2020-01-06 NOTE — Telephone Encounter (Signed)
Keflex 500 mg BID, # 28 with 1 RF's RX for above e-scribed and sent to pharmacy on record  [facility] [phone] [place], [place] - [address] AT [facility] [address] & [address] [place] [address] Phone: [phone] Fax: [phone]

## 2020-02-08 DIAGNOSIS — E559 Vitamin D deficiency, unspecified: Secondary | ICD-10-CM | POA: Diagnosis not present

## 2020-05-04 DIAGNOSIS — E559 Vitamin D deficiency, unspecified: Secondary | ICD-10-CM | POA: Diagnosis not present

## 2020-07-27 DIAGNOSIS — Z0000 Encounter for general adult medical examination without abnormal findings: Secondary | ICD-10-CM | POA: Diagnosis not present

## 2020-07-27 DIAGNOSIS — Z6841 Body Mass Index (BMI) 40.0 and over, adult: Secondary | ICD-10-CM | POA: Diagnosis not present

## 2020-07-27 DIAGNOSIS — E559 Vitamin D deficiency, unspecified: Secondary | ICD-10-CM | POA: Diagnosis not present

## 2020-10-24 ENCOUNTER — Telehealth: Payer: Self-pay | Admitting: Family

## 2020-10-24 MED ORDER — FLUCONAZOLE 150 MG PO TABS: 150.0000 mg | ORAL_TABLET | Freq: Every day | ORAL | 0 refills | Status: DC

## 2020-10-24 NOTE — Telephone Encounter (Signed)
Diflucan 150 mg daily, # 1 with 1 RF's.Malena Peer for above e-scribed and sent to pharmacy on record  [facility] [phone] [place], [place] - [address] AT [facility] [address] & [address] [place] [address] Phone: [phone] Fax: [phone]

## 2020-11-21 ENCOUNTER — Telehealth: Payer: Self-pay | Admitting: Family

## 2020-11-21 MED ORDER — CEPHALEXIN 500 MG PO CAPS: 500.0000 mg | ORAL_CAPSULE | Freq: Two times a day (BID) | ORAL | 1 refills | Status: DC

## 2020-11-21 NOTE — Telephone Encounter (Signed)
Keflex 500 mg BID, # 28 with 1 RF's for recurrent skin issues. RX for above e-scribed and sent to pharmacy on record  [facility] [phone] [place], [place] - [address] AT [facility] [address] & [address] [place] [address] Phone: [phone] Fax: [phone]

## 2020-12-21 DIAGNOSIS — R6883 Chills (without fever): Secondary | ICD-10-CM | POA: Diagnosis not present

## 2020-12-21 DIAGNOSIS — R059 Cough, unspecified: Secondary | ICD-10-CM | POA: Diagnosis not present

## 2020-12-21 DIAGNOSIS — R4189 Other symptoms and signs involving cognitive functions and awareness: Secondary | ICD-10-CM | POA: Diagnosis not present

## 2020-12-21 DIAGNOSIS — Z20822 Contact with and (suspected) exposure to covid-19: Secondary | ICD-10-CM | POA: Diagnosis not present

## 2021-01-07 ENCOUNTER — Telehealth: Payer: Self-pay | Admitting: Family

## 2021-01-07 MED ORDER — ONDANSETRON 8 MG PO TBDP: 8.0000 mg | ORAL_TABLET | Freq: Three times a day (TID) | ORAL | 0 refills | Status: AC | PRN

## 2021-01-07 NOTE — Telephone Encounter (Signed)
Zofran 8 mg every 8 hours for nausea, # 20 with no RF's.RX for above e-scribed and sent to pharmacy on record  [facility] [phone] [place], [place] - [address] AT [facility] [address] & [address] [place] [address] Phone: [phone] Fax: [phone]

## 2021-01-09 ENCOUNTER — Telehealth: Payer: Self-pay | Admitting: Family

## 2021-01-09 DIAGNOSIS — Z3200 Encounter for pregnancy test, result unknown: Secondary | ICD-10-CM | POA: Diagnosis not present

## 2021-01-09 MED ORDER — PROMETHAZINE HCL 25 MG PO TABS: 25.0000 mg | ORAL_TABLET | Freq: Four times a day (QID) | ORAL | 0 refills | Status: AC | PRN

## 2021-01-09 NOTE — Telephone Encounter (Signed)
Phenergan 25 mg tablets for nausea and vomiting, # 30 with no RF's.RX for above e-scribed and sent to pharmacy on record  [facility] [phone] [place], [place] - [address] AT [facility] [address] & [address] [place] [address] Phone: [phone] Fax: [phone]

## 2021-02-08 DIAGNOSIS — H5213 Myopia, bilateral: Secondary | ICD-10-CM | POA: Diagnosis not present

## 2021-02-08 DIAGNOSIS — Z309 Encounter for contraceptive management, unspecified: Secondary | ICD-10-CM | POA: Diagnosis not present

## 2021-02-26 DIAGNOSIS — Z30017 Encounter for initial prescription of implantable subdermal contraceptive: Secondary | ICD-10-CM | POA: Diagnosis not present

## 2021-03-06 ENCOUNTER — Telehealth: Payer: Self-pay | Admitting: Family

## 2021-03-06 ENCOUNTER — Other Ambulatory Visit (HOSPITAL_COMMUNITY): Payer: Self-pay

## 2021-03-06 MED ORDER — FLUCONAZOLE 150 MG PO TABS
150.0000 mg | ORAL_TABLET | Freq: Every day | ORAL | 1 refills | Status: DC
  Filled 2021-03-06: qty 2, 7d supply, fill #0

## 2021-03-06 NOTE — Telephone Encounter (Signed)
Diflucan 150 mg daily, # 2 with 1 RF's.RX for above e-scribed and sent to pharmacy on record  [facility] [phone] [place], [place] - [address] AT [facility] [address] & [address] [place] [address] Phone: [phone] Fax: [phone]

## 2021-03-08 ENCOUNTER — Telehealth: Payer: 59 | Admitting: Physician Assistant

## 2021-03-08 DIAGNOSIS — N898 Other specified noninflammatory disorders of vagina: Secondary | ICD-10-CM

## 2021-03-08 MED ORDER — METRONIDAZOLE 500 MG PO TABS: 500.0000 mg | ORAL_TABLET | Freq: Two times a day (BID) | ORAL | 0 refills | Status: AC

## 2021-03-08 NOTE — Progress Notes (Signed)

## 2021-09-24 DIAGNOSIS — Z124 Encounter for screening for malignant neoplasm of cervix: Secondary | ICD-10-CM | POA: Diagnosis not present

## 2021-09-24 DIAGNOSIS — Z6841 Body Mass Index (BMI) 40.0 and over, adult: Secondary | ICD-10-CM | POA: Diagnosis not present

## 2021-09-24 DIAGNOSIS — E559 Vitamin D deficiency, unspecified: Secondary | ICD-10-CM | POA: Diagnosis not present

## 2021-09-24 DIAGNOSIS — Z0000 Encounter for general adult medical examination without abnormal findings: Secondary | ICD-10-CM | POA: Diagnosis not present

## 2021-09-24 DIAGNOSIS — Z113 Encounter for screening for infections with a predominantly sexual mode of transmission: Secondary | ICD-10-CM | POA: Diagnosis not present

## 2021-11-06 ENCOUNTER — Other Ambulatory Visit (HOSPITAL_COMMUNITY): Payer: Self-pay

## 2021-11-06 ENCOUNTER — Telehealth: Payer: Self-pay | Admitting: Family

## 2021-11-06 MED ORDER — CEPHALEXIN 500 MG PO CAPS
500.0000 mg | ORAL_CAPSULE | Freq: Two times a day (BID) | ORAL | 1 refills | Status: DC
  Filled 2021-11-06: qty 28, 14d supply, fill #0
  Filled 2022-04-28: qty 28, 14d supply, fill #1

## 2021-11-06 NOTE — Telephone Encounter (Signed)
Keflex 500 mg BID, # 28 with 1 RF's.RX for above e-scribed and sent to pharmacy on record [facility].

## 2021-11-09 ENCOUNTER — Other Ambulatory Visit (HOSPITAL_COMMUNITY): Payer: Self-pay

## 2021-11-10 ENCOUNTER — Ambulatory Visit: Payer: 59

## 2021-11-11 ENCOUNTER — Ambulatory Visit
Admission: RE | Admit: 2021-11-11 | Discharge: 2021-11-11 | Disposition: A | Discharge status: Home / Self Care | Payer: 59 | Source: Ambulatory Visit | Attending: Emergency Medicine | Admitting: Emergency Medicine

## 2021-11-11 ENCOUNTER — Telehealth: Payer: Self-pay

## 2021-11-11 ENCOUNTER — Ambulatory Visit: Payer: 59

## 2021-11-11 VITALS — BP 119/84 | HR 83 | Temp 98.2°F | Resp 16

## 2021-11-11 DIAGNOSIS — L732 Hidradenitis suppurativa: Secondary | ICD-10-CM | POA: Diagnosis not present

## 2021-11-11 MED ORDER — SULFAMETHOXAZOLE-TRIMETHOPRIM 800-160 MG PO TABS: 2.0000 | ORAL_TABLET | Freq: Two times a day (BID) | ORAL | 0 refills | Status: DC

## 2021-11-11 MED ORDER — FLUCONAZOLE 150 MG PO TABS: 150.0000 mg | ORAL_TABLET | ORAL | 0 refills | Status: DC

## 2021-11-11 MED ORDER — SULFAMETHOXAZOLE-TRIMETHOPRIM 800-160 MG PO TABS: 2.0000 | ORAL_TABLET | Freq: Two times a day (BID) | ORAL | 0 refills | Status: AC

## 2021-11-11 MED ORDER — CLINDAMYCIN PHOSPHATE 1 % EX GEL: CUTANEOUS | 2 refills | Status: AC

## 2021-11-11 MED ORDER — FLUCONAZOLE 150 MG PO TABS: 150.0000 mg | ORAL_TABLET | ORAL | 0 refills | Status: AC

## 2021-11-11 MED ORDER — CLINDAMYCIN PHOSPHATE 1 % EX GEL: CUTANEOUS | 2 refills | Status: DC

## 2021-11-11 NOTE — ED Provider Notes (Signed)
[facility]    CSN: [id number] Arrival date & time: 11/11/21  0804    HISTORY   Chief Complaint  Patient presents with   Skin Ulcer    Boil armpit - Entered by patient   Abscess   HPI Kristin Lang is a pleasant, 36 y.o. female who presents to urgent care today. Patient reports a 9-year history of hidradenitis suppurativa, used to initially get lesions just in her pubic area but stopped wearing underwear which resolved this problem and now seems to only get them in her right axilla.  Patient states she uses special deodorant called [place], is diligent with applying warm compresses to any suspicious lumps and regularly contacts her nurse practitioner for refills of Keflex whenever she feels the lesion is getting bigger than it should.  Patient states lesions that she currently has under her right arm has become large, painful and is now draining purulent fluid that is green in color, denies odor.  Patient states she has tried several times to apply pressure to pop the lesion, states initially will drain and that immediately it stops draining and becomes larger, redder and more painful.  Patient denies fever, aches, chills, arm swelling, red streaking.  The history is provided by the patient.   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 Authorizing Provider  acetaminophen  (TYLENOL) 500 MG tablet Take 500-1,000 mg by mouth every 6 (six) hours as needed for fever or headache.    [provider]  cephALEXin (KEFLEX) 500 MG capsule Take 1 capsule by mouth 2 times daily. 11/06/21   Paretta-Leahey, Miachel Roux, NP  fluconazole (DIFLUCAN) 150 MG tablet Take 1 tablet (150 mg total) by mouth as directed. Repeat in 1 week if needed 03/06/21   Paretta-Leahey, Miachel Roux, NP  medroxyPROGESTERone (DEPO-PROVERA) 150 MG/ML injection Inject 150 mg into the muscle every 3 (three) months.    [provider]  ondansetron (ZOFRAN ODT) 8 MG disintegrating tablet Take 1 tablet (8 mg total) by mouth every 8 (eight) hours as needed for nausea or vomiting. 01/07/21   Paretta-Leahey, Miachel Roux, NP  promethazine (PHENERGAN) 25 MG tablet Take 1 tablet (25 mg total) by mouth every 6 (six) hours as needed for nausea or vomiting. 01/09/21   Paretta-Leahey, Miachel Roux, NP  fluticasone (FLONASE) 50 MCG/ACT nasal spray Place 1 spray into both nostrils daily. 08/02/19 10/19/19  Darr, Gerilyn Pilgrim, PA-C    Family History Family History  Problem Relation Age of Onset   Thyroid nodules Mother    Prostate cancer Father  Hypertension Maternal Grandmother    Diabetes Maternal Grandmother    Hypertension Maternal Grandfather    Diabetes Maternal Grandfather    Social History Social History   Tobacco Use   Smoking status: Never   Smokeless tobacco: Never  Vaping Use   Vaping Use: Never used  Substance Use Topics   Alcohol use: Yes    Comment: daily for last couple weeks   Drug use: No   Allergies   Doxycycline  Review of Systems Review of Systems Pertinent findings revealed after performing a 14 point review of systems has been noted in the history of present illness.  Physical Exam Triage Vital Signs ED Triage Vitals  Enc Vitals Group     BP 02/09/21 0827 (!) 147/82     Pulse Rate 02/09/21 0827 72     Resp 02/09/21 0827 18     Temp 02/09/21 0827 98.3 F (36.8 C)     Temp Source 02/09/21  0827 Oral     SpO2 02/09/21 0827 98 %     Weight --      Height --      Head Circumference --      Peak Flow --      Pain Score 02/09/21 0826 5     Pain Loc --      Pain Edu? --      Excl. in GC? --   No data found.  Updated Vital Signs BP 119/84 (BP Location: Right Arm)   Pulse 83   Temp 98.2 F (36.8 C) (Oral)   Resp 16   SpO2 98%   Physical Exam Vitals and nursing note reviewed.  Constitutional:      General: She is not in acute distress.    Appearance: Normal appearance.  HENT:     Head: Normocephalic and atraumatic.  Eyes:     Pupils: Pupils are equal, round, and reactive to light.  Cardiovascular:     Rate and Rhythm: Normal rate and regular rhythm.  Pulmonary:     Effort: Pulmonary effort is normal.     Breath sounds: Normal breath sounds.  Musculoskeletal:        General: Normal range of motion.     Cervical back: Normal range of motion and neck supple.  Skin:    General: Skin is warm and dry.     Findings: Lesion (2 palpable lesions concerning for hidradenitis suppurativa, the first 1 cm and nontender, the larger is 3 cm and exquisitely tender surrounding erythema, draining purulent fluid without odor) present.  Neurological:     General: No focal deficit present.     Mental Status: She is alert and oriented to person, place, and time. Mental status is at baseline.  Psychiatric:        Mood and Affect: Mood normal.        Behavior: Behavior normal.        Thought Content: Thought content normal.        Judgment: Judgment normal.     Visual Acuity Right Eye Distance:   Left Eye Distance:   Bilateral Distance:    Right Eye Near:   Left Eye Near:    Bilateral Near:     UC Couse / Diagnostics / Procedures:     Radiology No results found.  Procedures Wound Care  Date/Time: 11/11/2021 9:14 AM  Performed by: Theadora Rama Scales, PA-C Authorized by: Theadora Rama Scales, PA-C   Consent:    Consent obtained:  Verbal   Consent  given by:   Patient   Risks, benefits, and alternatives were discussed: yes     Risks discussed:  Bleeding, infection, pain, nerve damage and poor cosmetic result   Alternatives discussed:  No treatment, delayed treatment, alternative treatment, observation and referral Universal protocol:    Procedure explained and questions answered to patient or proxy's satisfaction: yes     Patient identity confirmed:  Verbally with patient and arm band Sedation:    Sedation type:  None Anesthesia:    Anesthesia method:  Local infiltration   Local anesthetic:  Lidocaine 2% w/o epi Procedure details:    Indications: benign lesion     Benign lesion:  Hidradenitis suppurativa   Wound location: Right axilla.   Wound age (days):  >14   Wound surface area (sq cm):  3   Debridement performed: No   Skin layer closed with:    Wound care performed: Lesion was injected with a mixture of 75% lidocaine 2% and 25% Kenalog 40 mg per 5 mL for a total of 1 cc, 0.25 mL injected into small lesion, 0.6 mL injected into larger lesion. Post-procedure details:    Procedure completion:  Tolerated Comments:     Wound was left open to drain, recommend covering wound if drainage is getting including otherwise leave uncovered.    (including critical care time) EKG  Pending results:  Labs Reviewed - No data to display  Medications Ordered in UC: Medications - No data to display  UC Diagnoses / Final Clinical Impressions(s)   I have reviewed the triage vital signs and the nursing notes.  Pertinent labs & imaging results that were available during my care of the patient were reviewed by me and considered in my medical decision making (see chart for details).    Final diagnoses:  Hidradenitis suppurativa of right axilla   Patient provided with contact information for local dermatologist that treats hidradenitis suppurativa.  Patient provided with topical Clindagel, 4 doses of weekly fluconazole, oral dose of Bactrim and  recommendations to apply warm compresses using microwavable moist heat, wash twice weekly with Nizoral shampoo or similar dandruff shampoo, continue Dove soap and current deodorant and to avoid attempting to pop lesions as this will dry the lesions deeper and cause tunneling.  Return precautions advised.  ED Prescriptions     Medication Sig Dispense Auth. Provider   clindamycin (CLINDAGEL) 1 % gel Apply to both underarms twice daily. 60 g Theadora Rama Scales, PA-C   sulfamethoxazole-trimethoprim (BACTRIM DS) 800-160 MG tablet Take 2 tablets by mouth 2 (two) times daily for 7 days. 28 tablet Theadora Rama Scales, PA-C   fluconazole (DIFLUCAN) 150 MG tablet Take 1 tablet (150 mg total) by mouth once a week for 28 days. 4 tablet Theadora Rama Scales, PA-C      PDMP not reviewed this encounter.  Pending results:  Labs Reviewed - No data to display  Discharge Instructions:   Discharge Instructions      As we discussed, treatment for hidradenitis suppurativa is multifactorial.  Patient education that I hope you find helpful.  Today, I performed an intralesional injection with Kenalog to reduce inflammation.  You do not need to keep your wound covered but because this will significantly reduce the inflammation of the lesion, you may notice a significant amount of drainage so covering her wound may be recommended until the drainage stops.  I also recommend that you begin taking Bactrim for presumed bacterial infection within the lesion due to the significant drainage that  is green in color that you have experienced.  I further recommend that he begin a weekly dose of fluconazole for the next 4 weeks which is an off label use but tends to be helpful in reducing recurrence.  I would like for you to reach out to [facility] PA-C with the [facility] health dermatology group to establish care for regular follow-up of your HS.  Other things to consider would be continuing to use mild soap and  the deodorant that you have found that works well for you.  I also recommend that you purchase and use Nizoral ketoconazole shampoo a few times a week as a body wash to reduce fungal load on your skin.  Thank you for visiting urgent care today.      Disposition Upon Discharge:  Condition: stable for discharge home  Patient presented with an acute illness with associated systemic symptoms and significant discomfort requiring urgent management. In my opinion, this is a condition that a prudent lay person (someone who possesses an average knowledge of health and medicine) may potentially expect to result in complications if not addressed urgently such as respiratory distress, impairment of bodily function or dysfunction of bodily organs.   Routine symptom specific, illness specific and/or disease specific instructions were discussed with the patient and/or caregiver at length.   As such, the patient has been evaluated and assessed, work-up was performed and treatment was provided in alignment with urgent care protocols and evidence based medicine.  Patient/parent/caregiver has been advised that the patient may require follow up for further testing and treatment if the symptoms continue in spite of treatment, as clinically indicated and appropriate.  Patient/parent/caregiver has been advised to return to the [facility] or PCP if no better; to PCP or the Emergency Department if new signs and symptoms develop, or if the current signs or symptoms continue to change or worsen for further workup, evaluation and treatment as clinically indicated and appropriate  The patient will follow up with their current PCP if and as advised. If the patient does not currently have a PCP we will assist them in obtaining one.   The patient may need specialty follow up if the symptoms continue, in spite of conservative treatment and management, for further workup, evaluation, consultation and treatment as clinically indicated  and appropriate.   Patient/parent/caregiver verbalized understanding and agreement of plan as discussed.  All questions were addressed during visit.  Please see discharge instructions below for further details of plan.  This office note has been dictated using Teaching laboratory technician.  Unfortunately, this method of dictation can sometimes lead to typographical or grammatical errors.  I apologize for your inconvenience in advance if this occurs.  Please do not hesitate to reach out to me if clarification is needed.      Theadora Rama Scales, PA-C 11/11/21 1146

## 2021-11-11 NOTE — Discharge Instructions (Addendum)
As we discussed, treatment for hidradenitis suppurativa is multifactorial.  Patient education that I hope you find helpful.  Today, I performed an intralesional injection with Kenalog to reduce inflammation.  You do not need to keep your wound covered but because this will significantly reduce the inflammation of the lesion, you may notice a significant amount of drainage so covering her wound may be recommended until the drainage stops.  I also recommend that you begin taking Bactrim for presumed bacterial infection within the lesion due to the significant drainage that is green in color that you have experienced.  I further recommend that he begin a weekly dose of fluconazole for the next 4 weeks which is an off label use but tends to be helpful in reducing recurrence.  I would like for you to reach out to [facility] PA-C with the [facility] health dermatology group to establish care for regular follow-up of your HS.  Other things to consider would be continuing to use mild soap and the deodorant that you have found that works well for you.  I also recommend that you purchase and use Nizoral ketoconazole shampoo a few times a week as a body wash to reduce fungal load on your skin.  Thank you for visiting urgent care today.

## 2021-11-11 NOTE — ED Triage Notes (Signed)
Pt states abscess under right armpit for the past week. States she is on Keflex for it but it has been draining green discharge for the past 2 days.

## 2021-11-14 DIAGNOSIS — T887XXA Unspecified adverse effect of drug or medicament, initial encounter: Secondary | ICD-10-CM | POA: Diagnosis not present

## 2021-11-14 DIAGNOSIS — H5789 Other specified disorders of eye and adnexa: Secondary | ICD-10-CM | POA: Diagnosis not present

## 2021-11-30 ENCOUNTER — Telehealth: Payer: 59 | Admitting: Family Medicine

## 2021-11-30 ENCOUNTER — Telehealth: Payer: 59

## 2021-11-30 DIAGNOSIS — B9689 Other specified bacterial agents as the cause of diseases classified elsewhere: Secondary | ICD-10-CM | POA: Diagnosis not present

## 2021-11-30 DIAGNOSIS — N760 Acute vaginitis: Secondary | ICD-10-CM

## 2021-11-30 MED ORDER — METRONIDAZOLE 500 MG PO TABS: 500.0000 mg | ORAL_TABLET | Freq: Two times a day (BID) | ORAL | 0 refills | Status: AC

## 2021-11-30 MED ORDER — METRONIDAZOLE 500 MG PO TABS: 500.0000 mg | ORAL_TABLET | Freq: Two times a day (BID) | ORAL | 0 refills | Status: DC

## 2021-11-30 NOTE — Progress Notes (Signed)
Virtual Visit Consent   Kristin Lang, you are scheduled for a virtual visit with a [facility] provider today. Just as with appointments in the office, your consent must be obtained to participate. Your consent will be active for this visit and any virtual visit you may have with one of our providers in the next 365 days. If you have a MyChart account, a copy of this consent can be sent to you electronically.  As this is a virtual visit, video technology does not allow for your provider to perform a traditional examination. This may limit your provider's ability to fully assess your condition. If your provider identifies any concerns that need to be evaluated in person or the need to arrange testing (such as labs, EKG, etc.), we will make arrangements to do so. Although advances in technology are sophisticated, we cannot ensure that it will always work on either your end or our end. If the connection with a video visit is poor, the visit may have to be switched to a telephone visit. With either a video or telephone visit, we are not always able to ensure that we have a secure connection.  By engaging in this virtual visit, you consent to the provision of healthcare and authorize for your insurance to be billed (if applicable) for the services provided during this visit. Depending on your insurance coverage, you may receive a charge related to this service.  I need to obtain your verbal consent now. Are you willing to proceed with your visit today? Kristin Lang has provided verbal consent on 11/30/2021 for a virtual visit (video or telephone). Georgana Curio, FNP  Date: 11/30/2021 2:12 PM  Virtual Visit via Video Note   I, Georgana Curio, connected with  Kristin Lang  ([id number], October 03, 1985) on 11/30/21 at  2:15 PM EDT by a video-enabled telemedicine application and verified that I am speaking with the correct person using two identifiers.  Location: Patient: Virtual Visit Location Patient:  Home Provider: Virtual Visit Location Provider: Home Office   I discussed the limitations of evaluation and management by telemedicine and the availability of in person appointments. The patient expressed understanding and agreed to proceed.    History of Present Illness: Kristin Lang is a 36 y.o. who identifies as a female who was assigned female at birth, and is being seen today for thin brown discharge with history of BV. Finished antibiotics 2 weeks ago then took diflucan.No itching. Hasnt smelled anything. No abd pain or back pain. No fever. 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    Allergies:  Allergies  Allergen Reactions   Doxycycline Rash    Rash/bumps   Medications:  Current Outpatient Medications:    acetaminophen (TYLENOL) 500 MG tablet, Take 500-1,000 mg by mouth every 6 (six) hours as needed for fever or headache., Disp: , Rfl:    cephALEXin (KEFLEX) 500 MG capsule, Take 1 capsule by mouth 2 times daily., Disp: 28 capsule, Rfl: 1   clindamycin (CLINDAGEL) 1 % gel, Apply to both underarms twice daily., Disp: 60 g, Rfl: 2   fluconazole (DIFLUCAN) 150 MG tablet, Take 1 tablet (150 mg total) by mouth once a week for 28 days., Disp: 4 tablet, Rfl: 0   medroxyPROGESTERone (DEPO-PROVERA) 150 MG/ML injection, Inject 150 mg into the muscle every 3 (  three) months., Disp: , Rfl:    ondansetron (ZOFRAN ODT) 8 MG disintegrating tablet, Take 1 tablet (8 mg total) by mouth every 8 (eight) hours as needed for nausea or vomiting., Disp: 20 tablet, Rfl: 0   promethazine (PHENERGAN) 25 MG tablet, Take 1 tablet (25 mg total) by mouth every 6 (six) hours as needed for nausea or vomiting., Disp: 30 tablet, Rfl: 0  Observations/Objective: Patient is well-developed, well-nourished in no acute distress.  Resting comfortably  at home.   Head is normocephalic, atraumatic.  No labored breathing.  Speech is clear and coherent with logical content.  Patient is alert and oriented at baseline.    Assessment and Plan: 1. Bacterial vaginitis  Med use and side effects discussed, f/u with GYN as needed.   Follow Up Instructions: I discussed the assessment and treatment plan with the patient. The patient was provided an opportunity to ask questions and all were answered. The patient agreed with the plan and demonstrated an understanding of the instructions.  A copy of instructions were sent to the patient via MyChart unless otherwise noted below.     The patient was advised to call back or seek an in-person evaluation if the symptoms worsen or if the condition fails to improve as anticipated.  Time:  I spent 10 minutes with the patient via telehealth technology discussing the above problems/concerns.    Georgana Curio, FNP

## 2021-11-30 NOTE — Patient Instructions (Signed)

## 2021-11-30 NOTE — Addendum Note (Signed)
Addended by: Georgana Curio on: 11/30/2021 06:38 PM   Modules accepted: Orders

## 2022-01-07 ENCOUNTER — Other Ambulatory Visit (HOSPITAL_COMMUNITY): Payer: Self-pay

## 2022-01-07 DIAGNOSIS — Z6841 Body Mass Index (BMI) 40.0 and over, adult: Secondary | ICD-10-CM | POA: Diagnosis not present

## 2022-01-07 DIAGNOSIS — E559 Vitamin D deficiency, unspecified: Secondary | ICD-10-CM | POA: Diagnosis not present

## 2022-01-07 MED ORDER — VITAMIN D3 1.25 MG (50000 UT) PO CAPS
1.0000 | ORAL_CAPSULE | ORAL | 0 refills | Status: AC
  Filled 2022-01-07 – 2022-02-13 (×2): qty 12, 84d supply, fill #0

## 2022-01-08 ENCOUNTER — Other Ambulatory Visit (HOSPITAL_COMMUNITY): Payer: Self-pay

## 2022-01-08 MED ORDER — WEGOVY 0.25 MG/0.5ML ~~LOC~~ SOAJ
0.2500 mg | SUBCUTANEOUS | 0 refills | Status: AC
  Filled 2022-01-08 – 2022-01-23 (×2): qty 2, 28d supply, fill #0

## 2022-01-14 ENCOUNTER — Other Ambulatory Visit (HOSPITAL_COMMUNITY): Payer: Self-pay

## 2022-01-23 ENCOUNTER — Other Ambulatory Visit (HOSPITAL_COMMUNITY): Payer: Self-pay

## 2022-01-30 ENCOUNTER — Telehealth: Payer: Self-pay | Admitting: Family

## 2022-01-30 MED ORDER — SERTRALINE HCL 25 MG PO TABS: 25.0000 mg | ORAL_TABLET | Freq: Every day | ORAL | 2 refills | Status: DC

## 2022-01-30 NOTE — Telephone Encounter (Signed)
Patient with increased anxiety, start Zoloft 25 mg daily, #30 with 2 RF's.RX for above e-scribed and sent to pharmacy on record  [facility]

## 2022-01-31 ENCOUNTER — Other Ambulatory Visit (HOSPITAL_COMMUNITY): Payer: Self-pay

## 2022-02-09 IMAGING — DX DG CHEST 1V PORT
1 series · 1 of 1 positions shown · non-contrast
Comparison: 12/13/2012

CLINICAL DATA: COVID

EXAM:
PORTABLE CHEST 1 VIEW

[chest ap]
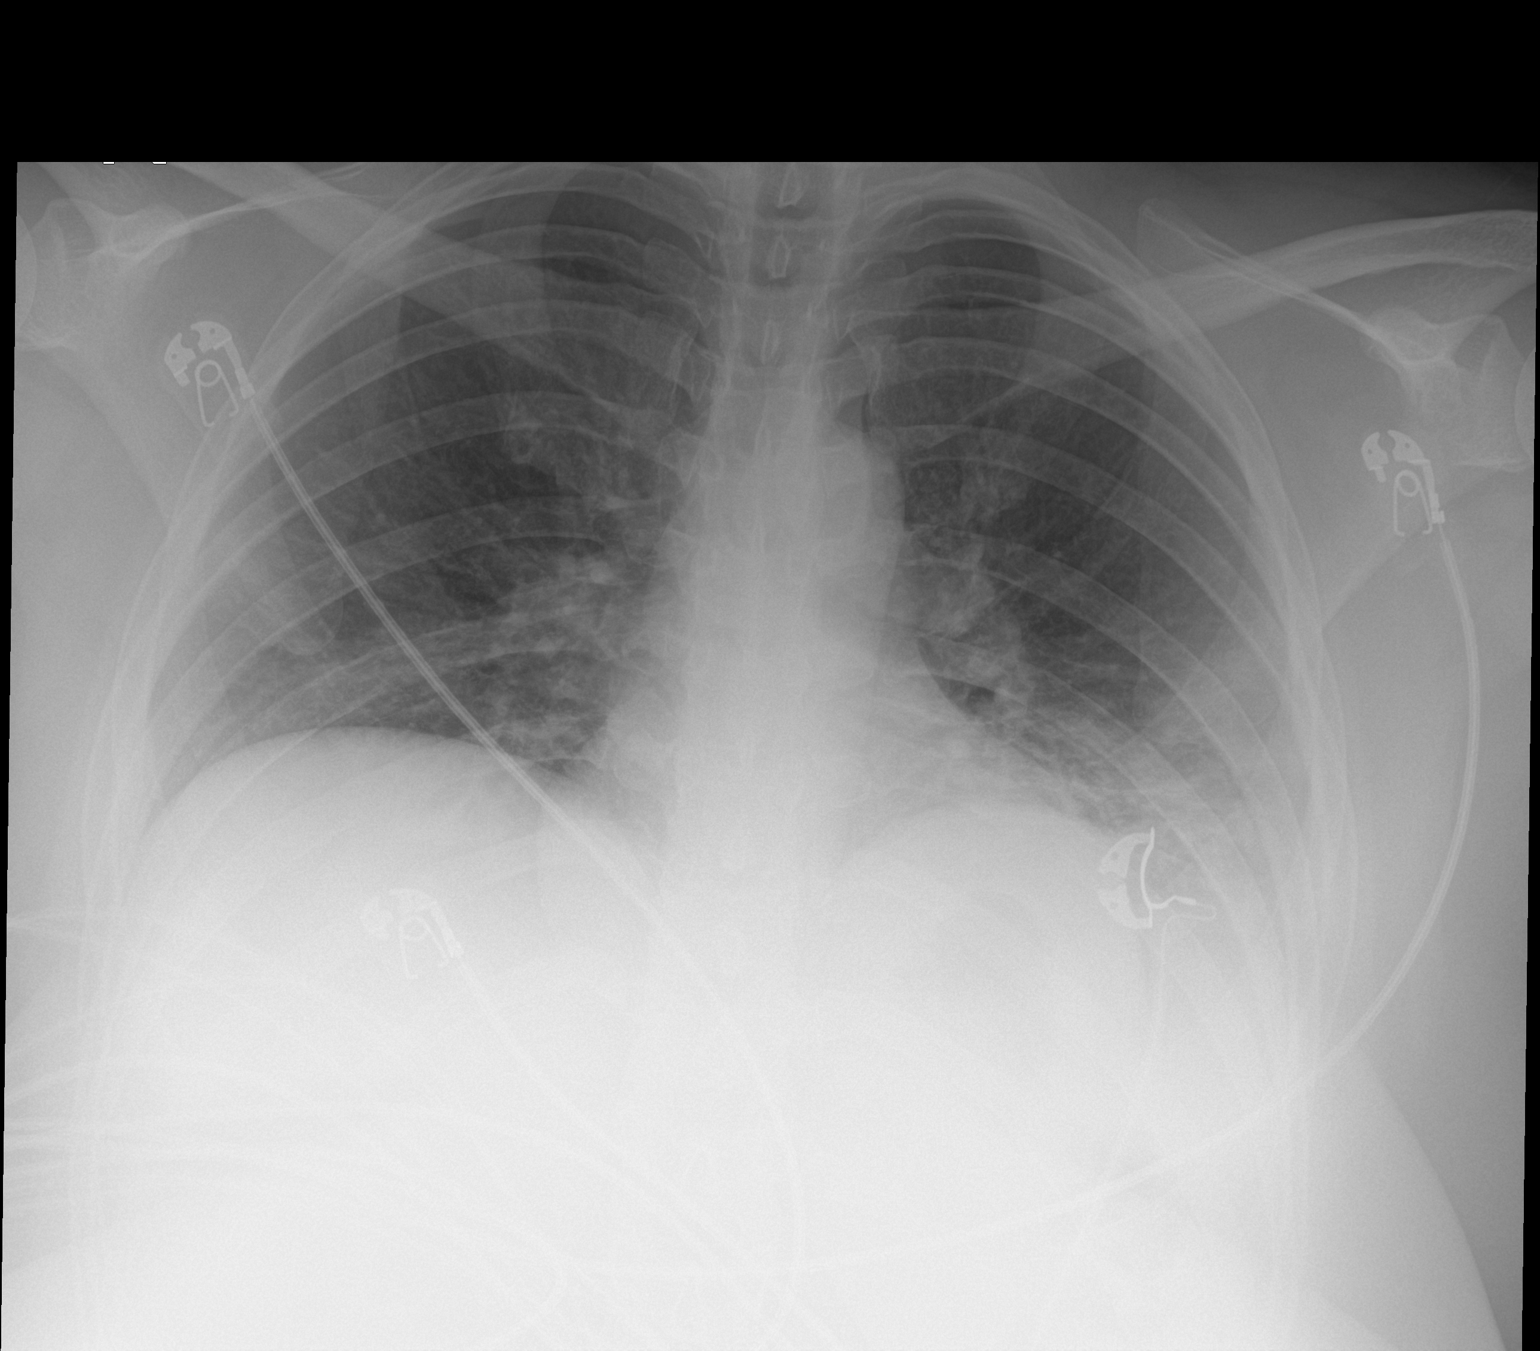

[1 of 1 positions shown; findings below may reference images not displayed]

FINDINGS: Low lung volumes. Patchy left lower lobe airspace opacity. Right
lung clear. Heart is normal size. No effusions or acute bony
abnormality.
IMPRESSION: Low lung volumes.

Patchy left lower lobe infiltrate/pneumonia.

## 2022-02-11 ENCOUNTER — Other Ambulatory Visit (HOSPITAL_BASED_OUTPATIENT_CLINIC_OR_DEPARTMENT_OTHER): Payer: Self-pay

## 2022-02-11 ENCOUNTER — Other Ambulatory Visit (HOSPITAL_COMMUNITY): Payer: Self-pay

## 2022-02-11 DIAGNOSIS — Z6841 Body Mass Index (BMI) 40.0 and over, adult: Secondary | ICD-10-CM | POA: Diagnosis not present

## 2022-02-11 DIAGNOSIS — E559 Vitamin D deficiency, unspecified: Secondary | ICD-10-CM | POA: Diagnosis not present

## 2022-02-11 MED ORDER — WEGOVY 0.5 MG/0.5ML ~~LOC~~ SOAJ
0.5000 mg | SUBCUTANEOUS | 0 refills | Status: AC
  Filled 2022-02-11: qty 2, 28d supply, fill #0

## 2022-02-11 MED ORDER — ONDANSETRON HCL 4 MG PO TABS
ORAL_TABLET | ORAL | 1 refills | Status: AC
  Filled 2022-02-11: qty 30, 30d supply, fill #0

## 2022-02-11 MED ORDER — PHENTERMINE HCL 37.5 MG PO CAPS
37.5000 mg | ORAL_CAPSULE | ORAL | 0 refills | Status: AC
  Filled 2022-02-11: qty 30, 30d supply, fill #0

## 2022-02-13 ENCOUNTER — Other Ambulatory Visit (HOSPITAL_COMMUNITY): Payer: Self-pay

## 2022-03-08 ENCOUNTER — Other Ambulatory Visit (HOSPITAL_COMMUNITY): Payer: Self-pay

## 2022-03-26 ENCOUNTER — Other Ambulatory Visit (HOSPITAL_COMMUNITY): Payer: Self-pay

## 2022-03-26 DIAGNOSIS — Z6841 Body Mass Index (BMI) 40.0 and over, adult: Secondary | ICD-10-CM | POA: Diagnosis not present

## 2022-03-26 DIAGNOSIS — F988 Other specified behavioral and emotional disorders with onset usually occurring in childhood and adolescence: Secondary | ICD-10-CM | POA: Diagnosis not present

## 2022-03-26 DIAGNOSIS — E559 Vitamin D deficiency, unspecified: Secondary | ICD-10-CM | POA: Diagnosis not present

## 2022-03-26 DIAGNOSIS — R632 Polyphagia: Secondary | ICD-10-CM | POA: Diagnosis not present

## 2022-03-26 MED ORDER — LISDEXAMFETAMINE DIMESYLATE 30 MG PO CAPS
30.0000 mg | ORAL_CAPSULE | Freq: Every morning | ORAL | 0 refills | Status: AC
  Filled 2022-03-26: qty 30, 30d supply, fill #0

## 2022-04-16 ENCOUNTER — Other Ambulatory Visit (HOSPITAL_COMMUNITY): Payer: Self-pay

## 2022-04-17 ENCOUNTER — Other Ambulatory Visit (HOSPITAL_COMMUNITY): Payer: Self-pay

## 2022-04-17 ENCOUNTER — Telehealth: Payer: Self-pay | Admitting: Family

## 2022-04-17 MED ORDER — SERTRALINE HCL 25 MG PO TABS
25.0000 mg | ORAL_TABLET | Freq: Every day | ORAL | 2 refills | Status: AC
  Filled 2022-04-17: qty 30, 30d supply, fill #0
  Filled 2022-06-24: qty 30, 30d supply, fill #1
  Filled 2022-08-21: qty 30, 30d supply, fill #2

## 2022-04-17 NOTE — Telephone Encounter (Signed)
Zoloft 25 mg daily, #30 with 2 RF's.RX for above e-scribed and sent to pharmacy on record [facility]

## 2022-04-18 ENCOUNTER — Other Ambulatory Visit (HOSPITAL_COMMUNITY): Payer: Self-pay

## 2022-04-19 ENCOUNTER — Other Ambulatory Visit (HOSPITAL_COMMUNITY): Payer: Self-pay

## 2022-04-19 MED ORDER — SERTRALINE HCL 25 MG PO TABS
25.0000 mg | ORAL_TABLET | Freq: Every day | ORAL | 1 refills | Status: AC
  Filled 2022-04-19: qty 30, 30d supply, fill #0

## 2022-04-22 ENCOUNTER — Other Ambulatory Visit (HOSPITAL_COMMUNITY): Payer: Self-pay

## 2022-04-29 ENCOUNTER — Other Ambulatory Visit (HOSPITAL_COMMUNITY): Payer: Self-pay

## 2022-05-08 ENCOUNTER — Other Ambulatory Visit (HOSPITAL_COMMUNITY): Payer: Self-pay

## 2022-05-08 ENCOUNTER — Telehealth: Payer: Self-pay | Admitting: Family

## 2022-05-08 MED ORDER — CEPHALEXIN 500 MG PO CAPS
500.0000 mg | ORAL_CAPSULE | Freq: Two times a day (BID) | ORAL | 2 refills | Status: AC
  Filled 2022-05-08: qty 28, 14d supply, fill #0

## 2022-05-08 NOTE — Telephone Encounter (Signed)
Refill for Keflex to [facility]

## 2022-05-08 NOTE — Telephone Encounter (Signed)
Keflex 500 mg BID for 14 days, #28 with 2 RF's.RX for above e-scribed and sent to pharmacy on record  [facility].

## 2022-05-15 DIAGNOSIS — R051 Acute cough: Secondary | ICD-10-CM | POA: Diagnosis not present

## 2022-05-27 ENCOUNTER — Other Ambulatory Visit (HOSPITAL_COMMUNITY): Payer: Self-pay

## 2022-05-27 MED ORDER — PHENTERMINE HCL 37.5 MG PO CAPS
37.5000 mg | ORAL_CAPSULE | Freq: Every morning | ORAL | 0 refills | Status: DC
  Filled 2022-05-27 – 2022-06-24 (×2): qty 30, 30d supply, fill #0

## 2022-06-04 ENCOUNTER — Other Ambulatory Visit (HOSPITAL_COMMUNITY): Payer: Self-pay

## 2022-06-24 ENCOUNTER — Other Ambulatory Visit (HOSPITAL_COMMUNITY): Payer: Self-pay

## 2022-06-24 MED ORDER — NORETHINDRONE ACETATE 5 MG PO TABS
5.0000 mg | ORAL_TABLET | Freq: Every day | ORAL | 0 refills | Status: DC
  Filled 2022-06-24: qty 30, 30d supply, fill #0

## 2022-07-09 DIAGNOSIS — Z3202 Encounter for pregnancy test, result negative: Secondary | ICD-10-CM | POA: Diagnosis not present

## 2022-07-09 DIAGNOSIS — N3000 Acute cystitis without hematuria: Secondary | ICD-10-CM | POA: Diagnosis not present

## 2022-07-09 DIAGNOSIS — N898 Other specified noninflammatory disorders of vagina: Secondary | ICD-10-CM | POA: Diagnosis not present

## 2022-07-26 ENCOUNTER — Other Ambulatory Visit (HOSPITAL_COMMUNITY): Payer: Self-pay

## 2022-07-26 MED ORDER — SERTRALINE HCL 25 MG PO TABS
25.0000 mg | ORAL_TABLET | Freq: Every day | ORAL | 1 refills | Status: AC
  Filled 2022-07-26: qty 30, 30d supply, fill #0

## 2022-07-26 MED ORDER — VITAMIN D3 1.25 MG (50000 UT) PO CAPS
50000.0000 [IU] | ORAL_CAPSULE | ORAL | 0 refills | Status: AC
  Filled 2022-07-26 – 2022-09-15 (×2): qty 12, 84d supply, fill #0

## 2022-07-26 MED ORDER — PHENTERMINE HCL 37.5 MG PO CAPS
37.5000 mg | ORAL_CAPSULE | Freq: Every morning | ORAL | 0 refills | Status: DC
  Filled 2022-07-26 – 2022-08-21 (×2): qty 30, 30d supply, fill #0

## 2022-08-01 ENCOUNTER — Other Ambulatory Visit (HOSPITAL_COMMUNITY): Payer: Self-pay

## 2022-08-05 ENCOUNTER — Other Ambulatory Visit (HOSPITAL_COMMUNITY): Payer: Self-pay

## 2022-08-13 ENCOUNTER — Other Ambulatory Visit (HOSPITAL_COMMUNITY): Payer: Self-pay

## 2022-08-20 ENCOUNTER — Other Ambulatory Visit (HOSPITAL_COMMUNITY): Payer: Self-pay

## 2022-08-20 MED ORDER — NORETHINDRONE ACETATE 5 MG PO TABS
5.0000 mg | ORAL_TABLET | Freq: Every day | ORAL | 4 refills | Status: AC
  Filled 2022-08-20 (×2): qty 30, 30d supply, fill #0

## 2022-08-21 ENCOUNTER — Other Ambulatory Visit (HOSPITAL_COMMUNITY): Payer: Self-pay

## 2022-09-10 DIAGNOSIS — Z8619 Personal history of other infectious and parasitic diseases: Secondary | ICD-10-CM | POA: Diagnosis not present

## 2022-09-10 DIAGNOSIS — Z9229 Personal history of other drug therapy: Secondary | ICD-10-CM | POA: Diagnosis not present

## 2022-09-16 ENCOUNTER — Other Ambulatory Visit (HOSPITAL_COMMUNITY): Payer: Self-pay

## 2022-09-16 ENCOUNTER — Other Ambulatory Visit: Payer: Self-pay

## 2022-09-27 DIAGNOSIS — E6609 Other obesity due to excess calories: Secondary | ICD-10-CM | POA: Diagnosis not present

## 2022-09-27 DIAGNOSIS — E559 Vitamin D deficiency, unspecified: Secondary | ICD-10-CM | POA: Diagnosis not present

## 2022-09-27 DIAGNOSIS — M25552 Pain in left hip: Secondary | ICD-10-CM | POA: Diagnosis not present

## 2022-09-27 DIAGNOSIS — Z6839 Body mass index (BMI) 39.0-39.9, adult: Secondary | ICD-10-CM | POA: Diagnosis not present

## 2022-09-27 DIAGNOSIS — Z1331 Encounter for screening for depression: Secondary | ICD-10-CM | POA: Diagnosis not present

## 2022-09-27 DIAGNOSIS — F411 Generalized anxiety disorder: Secondary | ICD-10-CM | POA: Diagnosis not present

## 2022-09-27 DIAGNOSIS — Z0000 Encounter for general adult medical examination without abnormal findings: Secondary | ICD-10-CM | POA: Diagnosis not present

## 2022-09-27 DIAGNOSIS — Z3042 Encounter for surveillance of injectable contraceptive: Secondary | ICD-10-CM | POA: Diagnosis not present

## 2022-09-27 DIAGNOSIS — Z975 Presence of (intrauterine) contraceptive device: Secondary | ICD-10-CM | POA: Diagnosis not present

## 2022-10-02 ENCOUNTER — Other Ambulatory Visit (HOSPITAL_COMMUNITY): Payer: Self-pay

## 2022-10-02 DIAGNOSIS — F909 Attention-deficit hyperactivity disorder, unspecified type: Secondary | ICD-10-CM | POA: Diagnosis not present

## 2022-10-02 DIAGNOSIS — E663 Overweight: Secondary | ICD-10-CM | POA: Diagnosis not present

## 2022-10-02 DIAGNOSIS — M25552 Pain in left hip: Secondary | ICD-10-CM | POA: Diagnosis not present

## 2022-10-02 DIAGNOSIS — M25551 Pain in right hip: Secondary | ICD-10-CM | POA: Diagnosis not present

## 2022-10-02 MED ORDER — SERTRALINE HCL 25 MG PO TABS
25.0000 mg | ORAL_TABLET | Freq: Every day | ORAL | 0 refills | Status: AC
  Filled 2022-10-02: qty 90, 90d supply, fill #0

## 2022-10-07 ENCOUNTER — Other Ambulatory Visit (HOSPITAL_COMMUNITY): Payer: Self-pay

## 2022-10-07 MED ORDER — SERTRALINE HCL 50 MG PO TABS
50.0000 mg | ORAL_TABLET | Freq: Every day | ORAL | 1 refills | Status: AC
  Filled 2022-10-07: qty 30, 30d supply, fill #0
  Filled 2022-12-17: qty 30, 30d supply, fill #1

## 2022-10-11 ENCOUNTER — Other Ambulatory Visit (HOSPITAL_COMMUNITY): Payer: Self-pay

## 2022-10-14 ENCOUNTER — Other Ambulatory Visit (HOSPITAL_COMMUNITY): Payer: Self-pay

## 2022-10-14 MED ORDER — PHENTERMINE HCL 37.5 MG PO CAPS
37.5000 mg | ORAL_CAPSULE | Freq: Every morning | ORAL | 0 refills | Status: AC
  Filled 2022-10-14 – 2022-12-17 (×2): qty 30, 30d supply, fill #0

## 2022-10-23 ENCOUNTER — Other Ambulatory Visit (HOSPITAL_COMMUNITY): Payer: Self-pay

## 2022-12-10 ENCOUNTER — Ambulatory Visit: Payer: Commercial Managed Care - PPO | Admitting: Skilled Nursing Facility1

## 2022-12-17 ENCOUNTER — Other Ambulatory Visit (HOSPITAL_COMMUNITY): Payer: Self-pay

## 2022-12-17 MED ORDER — NORETHIN ACE-ETH ESTRAD-FE 1-20 MG-MCG PO TABS
1.0000 | ORAL_TABLET | Freq: Every day | ORAL | 11 refills | Status: DC
  Filled 2022-12-17: qty 28, 28d supply, fill #0
  Filled 2023-01-27: qty 28, 28d supply, fill #1
  Filled 2023-04-12: qty 28, 28d supply, fill #2
  Filled 2023-06-17: qty 28, 28d supply, fill #3
  Filled 2023-08-01: qty 28, 28d supply, fill #4
  Filled 2023-09-01: qty 28, 28d supply, fill #5
  Filled 2023-11-30: qty 28, 28d supply, fill #6
  Filled 2023-12-03: qty 28, 28d supply, fill #7

## 2022-12-17 MED ORDER — METRONIDAZOLE 500 MG PO TABS
500.0000 mg | ORAL_TABLET | Freq: Two times a day (BID) | ORAL | 3 refills | Status: AC
  Filled 2022-12-17: qty 14, 7d supply, fill #0
  Filled 2023-11-30: qty 14, 7d supply, fill #1

## 2022-12-18 ENCOUNTER — Other Ambulatory Visit (HOSPITAL_COMMUNITY): Payer: Self-pay

## 2023-01-27 ENCOUNTER — Other Ambulatory Visit (HOSPITAL_COMMUNITY): Payer: Self-pay

## 2023-01-29 ENCOUNTER — Other Ambulatory Visit (HOSPITAL_COMMUNITY): Payer: Self-pay

## 2023-03-28 ENCOUNTER — Ambulatory Visit: Payer: Self-pay | Admitting: Dietician

## 2023-04-12 ENCOUNTER — Other Ambulatory Visit (HOSPITAL_COMMUNITY): Payer: Self-pay

## 2023-04-15 ENCOUNTER — Other Ambulatory Visit (HOSPITAL_COMMUNITY): Payer: Self-pay

## 2023-06-17 ENCOUNTER — Other Ambulatory Visit (HOSPITAL_COMMUNITY): Payer: Self-pay

## 2023-07-18 ENCOUNTER — Other Ambulatory Visit (HOSPITAL_COMMUNITY): Payer: Self-pay

## 2023-07-18 MED ORDER — SOD FLUORIDE-POTASSIUM NITRATE 1.1-5 % DT GEL
DENTAL | 2 refills | Status: AC
  Filled 2023-07-18: qty 100, 30d supply, fill #0
  Filled 2023-09-01 – 2023-10-21 (×3): qty 100, 30d supply, fill #1
  Filled 2023-12-24 – 2024-01-07 (×2): qty 100, 30d supply, fill #2

## 2023-07-21 ENCOUNTER — Other Ambulatory Visit (HOSPITAL_COMMUNITY): Payer: Self-pay

## 2023-07-22 ENCOUNTER — Other Ambulatory Visit (HOSPITAL_COMMUNITY): Payer: Self-pay

## 2023-07-30 ENCOUNTER — Other Ambulatory Visit (HOSPITAL_COMMUNITY): Payer: Self-pay

## 2023-07-30 MED ORDER — PHENTERMINE HCL 37.5 MG PO TABS
18.7500 mg | ORAL_TABLET | Freq: Every morning | ORAL | 2 refills | Status: AC
Start: 2023-07-30 — End: ?
  Filled 2023-07-30: qty 30, 30d supply, fill #0
  Filled 2023-09-01: qty 30, 30d supply, fill #1

## 2023-08-01 ENCOUNTER — Other Ambulatory Visit (HOSPITAL_COMMUNITY): Payer: Self-pay

## 2023-09-01 ENCOUNTER — Other Ambulatory Visit (HOSPITAL_COMMUNITY): Payer: Self-pay

## 2023-09-01 ENCOUNTER — Other Ambulatory Visit: Payer: Self-pay

## 2023-09-02 ENCOUNTER — Other Ambulatory Visit (HOSPITAL_COMMUNITY): Payer: Self-pay

## 2023-09-02 ENCOUNTER — Other Ambulatory Visit: Payer: Self-pay

## 2023-09-09 ENCOUNTER — Other Ambulatory Visit (HOSPITAL_COMMUNITY): Payer: Self-pay

## 2023-09-10 ENCOUNTER — Other Ambulatory Visit (HOSPITAL_COMMUNITY): Payer: Self-pay

## 2023-09-15 ENCOUNTER — Other Ambulatory Visit (HOSPITAL_COMMUNITY): Payer: Self-pay

## 2023-09-22 ENCOUNTER — Other Ambulatory Visit (HOSPITAL_COMMUNITY): Payer: Self-pay

## 2023-10-21 ENCOUNTER — Other Ambulatory Visit (HOSPITAL_COMMUNITY): Payer: Self-pay

## 2023-10-22 ENCOUNTER — Other Ambulatory Visit (HOSPITAL_COMMUNITY): Payer: Self-pay

## 2023-12-03 ENCOUNTER — Other Ambulatory Visit (HOSPITAL_COMMUNITY): Payer: Self-pay

## 2023-12-24 ENCOUNTER — Other Ambulatory Visit: Payer: Self-pay

## 2023-12-24 ENCOUNTER — Other Ambulatory Visit (HOSPITAL_COMMUNITY): Payer: Self-pay

## 2023-12-25 ENCOUNTER — Other Ambulatory Visit (HOSPITAL_COMMUNITY): Payer: Self-pay

## 2024-01-01 ENCOUNTER — Other Ambulatory Visit (HOSPITAL_COMMUNITY): Payer: Self-pay

## 2024-01-02 ENCOUNTER — Other Ambulatory Visit (HOSPITAL_COMMUNITY): Payer: Self-pay

## 2024-01-06 ENCOUNTER — Other Ambulatory Visit (HOSPITAL_COMMUNITY): Payer: Self-pay

## 2024-01-07 ENCOUNTER — Other Ambulatory Visit (HOSPITAL_COMMUNITY): Payer: Self-pay

## 2024-01-07 MED ORDER — NORETHIN ACE-ETH ESTRAD-FE 1-20 MG-MCG PO TABS
1.0000 | ORAL_TABLET | Freq: Every day | ORAL | 11 refills | Status: DC
  Filled 2024-01-07: qty 28, 28d supply, fill #0

## 2024-01-08 ENCOUNTER — Other Ambulatory Visit (HOSPITAL_COMMUNITY): Payer: Self-pay

## 2024-01-13 ENCOUNTER — Other Ambulatory Visit (HOSPITAL_COMMUNITY): Payer: Self-pay
